# Patient Record
Sex: Male | Born: 1970 | Race: White | Hispanic: No | Marital: Single | Smoking: Current every day smoker
Health system: Southern US, Community
[De-identification: ages and names within clinical notes are randomized; demographics above are authoritative.]

## PROBLEM LIST (undated history)

## (undated) DIAGNOSIS — I219 Acute myocardial infarction, unspecified: Secondary | ICD-10-CM

## (undated) HISTORY — PX: CERVICAL SPINE SURGERY: SHX589

---

## 2011-09-19 ENCOUNTER — Emergency Department: Payer: Self-pay | Admitting: Emergency Medicine

## 2013-02-17 ENCOUNTER — Emergency Department: Payer: Self-pay | Admitting: Emergency Medicine

## 2016-09-24 ENCOUNTER — Emergency Department (HOSPITAL_COMMUNITY)
Admission: EM | Admit: 2016-09-24 | Discharge: 2016-09-24 | Disposition: A | Discharge status: Home / Self Care | Payer: BLUE CROSS/BLUE SHIELD | Attending: Emergency Medicine | Admitting: Emergency Medicine

## 2016-09-24 ENCOUNTER — Emergency Department (HOSPITAL_COMMUNITY): Payer: BLUE CROSS/BLUE SHIELD

## 2016-09-24 ENCOUNTER — Encounter (HOSPITAL_COMMUNITY): Payer: Self-pay | Admitting: Emergency Medicine

## 2016-09-24 DIAGNOSIS — M5412 Radiculopathy, cervical region: Secondary | ICD-10-CM | POA: Diagnosis not present

## 2016-09-24 DIAGNOSIS — F1290 Cannabis use, unspecified, uncomplicated: Secondary | ICD-10-CM | POA: Diagnosis not present

## 2016-09-24 DIAGNOSIS — Z791 Long term (current) use of non-steroidal anti-inflammatories (NSAID): Secondary | ICD-10-CM | POA: Insufficient documentation

## 2016-09-24 DIAGNOSIS — R200 Anesthesia of skin: Secondary | ICD-10-CM | POA: Diagnosis present

## 2016-09-24 DIAGNOSIS — F17210 Nicotine dependence, cigarettes, uncomplicated: Secondary | ICD-10-CM | POA: Insufficient documentation

## 2016-09-24 MED ORDER — PREDNISONE 50 MG PO TABS: ORAL_TABLET | ORAL | 0 refills | Status: DC

## 2016-09-24 MED ORDER — OXYCODONE-ACETAMINOPHEN 5-325 MG PO TABS: 1.0000 | ORAL_TABLET | ORAL | 0 refills | Status: DC | PRN

## 2016-09-24 MED ORDER — NAPROXEN 500 MG PO TABS: 500.0000 mg | ORAL_TABLET | Freq: Two times a day (BID) | ORAL | 0 refills | Status: DC

## 2016-09-24 NOTE — ED Notes (Signed)
Pt resting with eyes closed, appears to be in no distress. Respirations are even and unlabored.  

## 2016-09-24 NOTE — ED Notes (Signed)
MD Cook at bedside. 
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
## 2016-09-24 NOTE — ED Triage Notes (Signed)
Patient states chronic pain in neck and right arm that radiates from right arm to shoulder. States weakness in right arm and hand that started a few weeks ago has gotten worse in the past 3 days.

## 2016-09-24 NOTE — Discharge Instructions (Signed)
You will need to see a neurosurgeon. Phone number given. Prescription for pain medicine, prednisone, anti-inflammatory medicine.

## 2017-05-15 IMAGING — CT CT CERVICAL SPINE W/O CM
4 of 7 series · 13 of 33 positions shown, 14 images · non-contrast
Comparison: None.

CLINICAL DATA: Right neck pain.  Right arm numbness for 2-3 weeks.

EXAM:
CT HEAD WITHOUT CONTRAST
CT CERVICAL SPINE WITHOUT CONTRAST
TECHNIQUE: Multidetector CT imaging of the head and cervical spine was
performed following the standard protocol without intravenous
contrast. Multiplanar CT image reconstructions of the cervical spine
were also generated.

[Series 8: c spine soft · axial · 0.32mm/px · z∈[-38,+94]mm · 4 of 110 slices shown]
[im 22/110  soft-tissue]
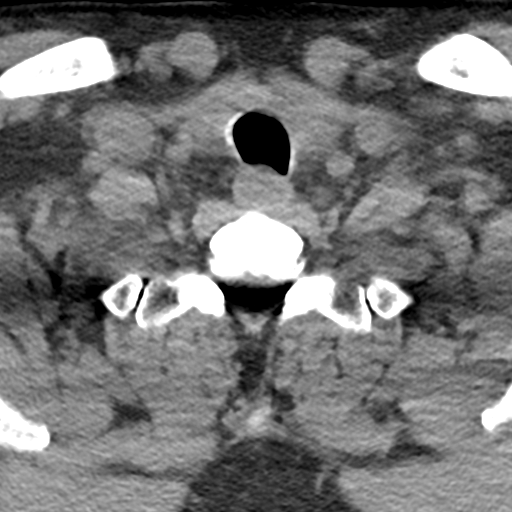
[im 44/110  soft-tissue]
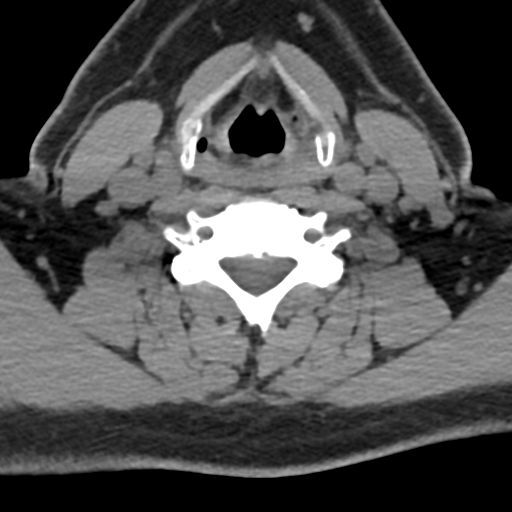
[im 66/110  soft-tissue]
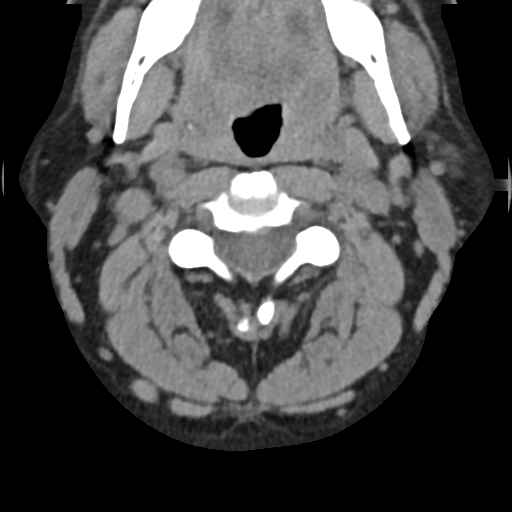
[im 88/110  soft-tissue]
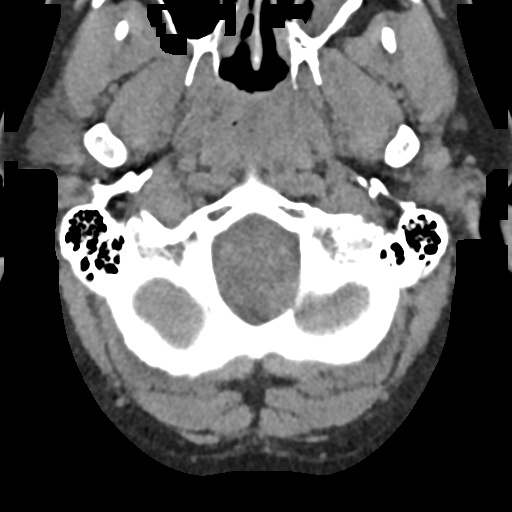

[Series 9: sagittal bone · sagittal · 0.25mm/px · 4 of 61 slices shown]
[im 13/61  bone]
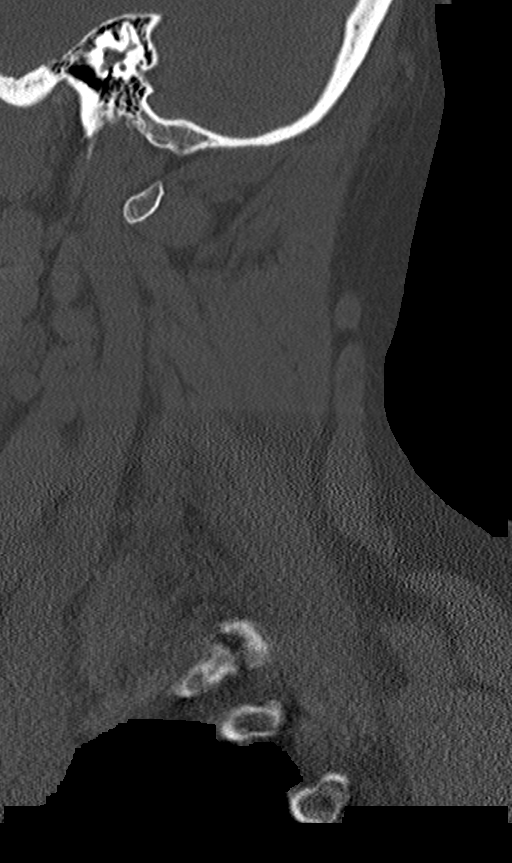
[im 25/61  bone]
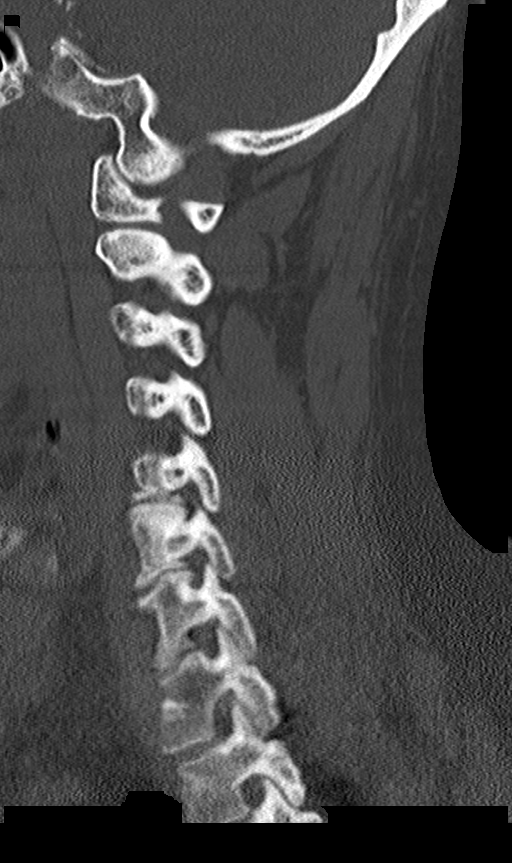
[im 37/61  bone]
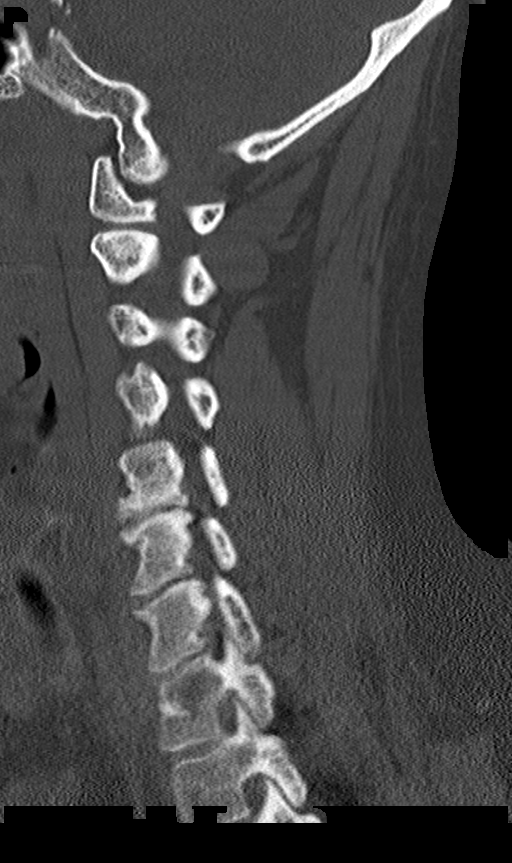
[im 49/61  bone]
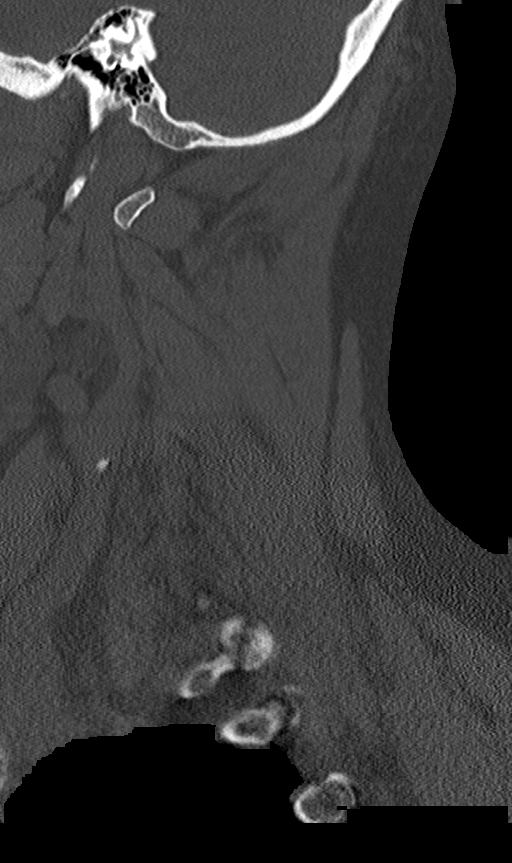

[Series 10: coronal bone · coronal · 0.32mm/px · 1 of 61 slices shown]
[im 31/61  bone]
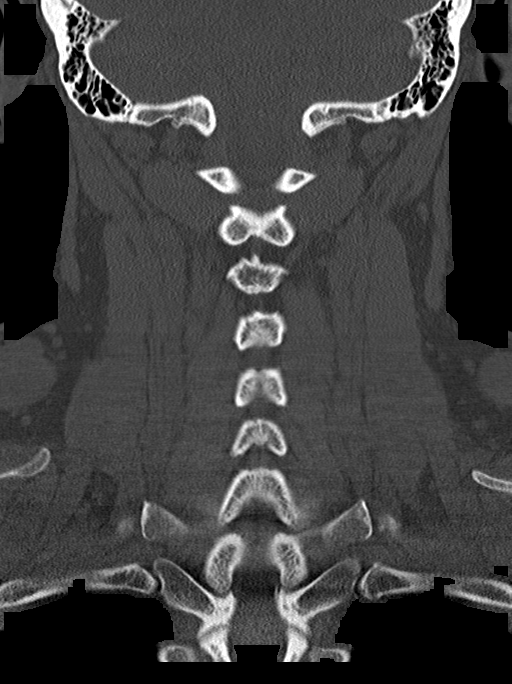

[Series 13: orthogonal bone · axial · 0.21mm/px · z∈[-56,+61]mm · 4 of 96 slices shown, 5 images]
[im 20/96  soft-tissue]
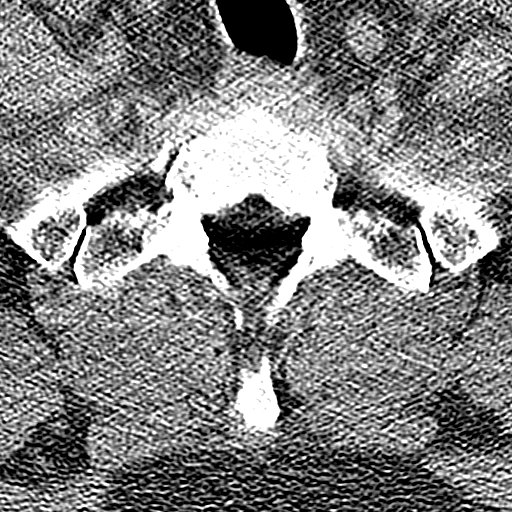
[im 20/96  bone]
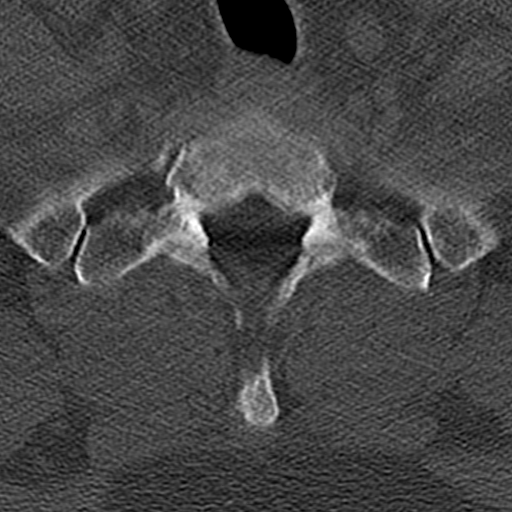
[im 39/96  bone]
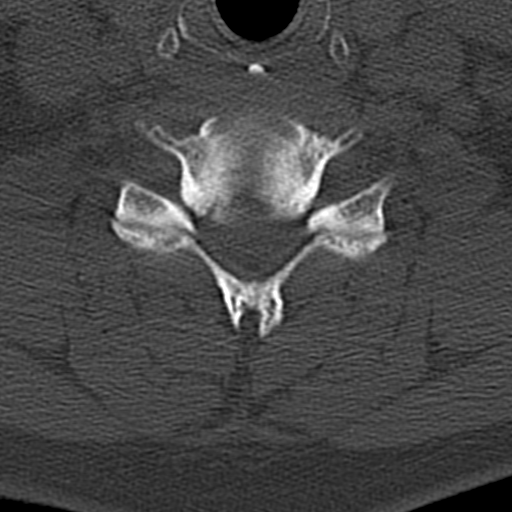
[im 58/96  bone]
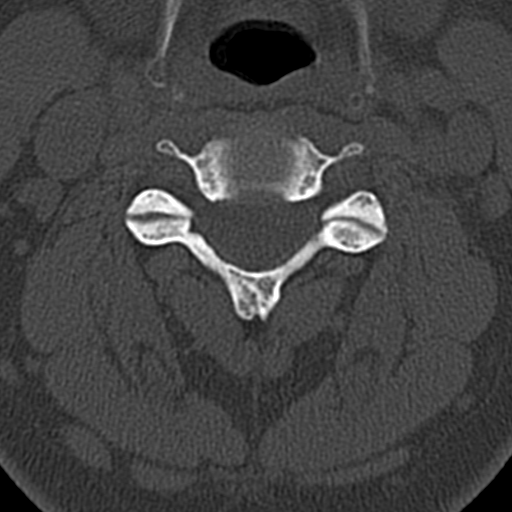
[im 77/96  bone]
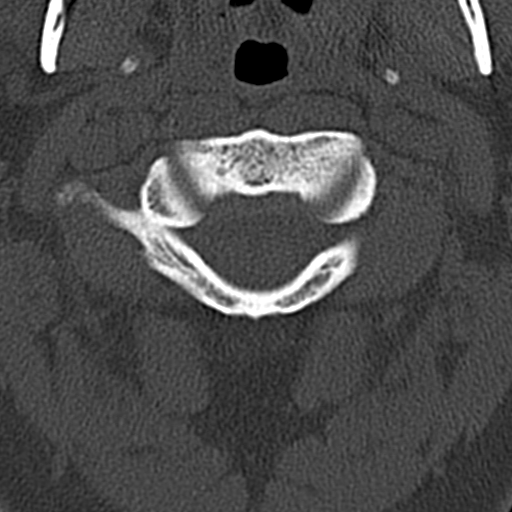

[13 of 33 positions shown; findings below may reference images not displayed]

FINDINGS: Brain: No acute intracranial abnormality. Specifically, no
hemorrhage, hydrocephalus, mass lesion, acute infarction, or
significant intracranial injury.

Vascular: No hyperdense vessel or unexpected calcification.

Skull: No acute calvarial abnormality.

Sinuses/Orbits: Mucosal thickening in the left maxillary sinus and
scattered left ethmoid air cells. Mastoid air cells clear. Orbital
soft tissues unremarkable.

Other: None

CT CERVICAL SPINE FINDINGS

Alignment: Normal

Skull base and vertebrae: No acute fracture. No primary bone lesion
or focal pathologic process.

Soft tissues and spinal canal: No prevertebral fluid or swelling. No
visible canal hematoma.

Disc levels: Disc space narrowing and spurring at C5-6 and C6-7.
Mild bilateral neural foraminal narrowing at C5-6 due to
uncovertebral spurring.

Upper chest: Negative

Other: None
IMPRESSION: No intracranial abnormality.

Chronic sinusitis.

Degenerative changes in the lower cervical spine as above. No acute
findings.

## 2022-07-19 ENCOUNTER — Other Ambulatory Visit: Payer: Self-pay

## 2022-07-19 DIAGNOSIS — Z122 Encounter for screening for malignant neoplasm of respiratory organs: Secondary | ICD-10-CM

## 2022-07-19 DIAGNOSIS — F17210 Nicotine dependence, cigarettes, uncomplicated: Secondary | ICD-10-CM

## 2022-07-19 DIAGNOSIS — Z87891 Personal history of nicotine dependence: Secondary | ICD-10-CM

## 2022-08-29 ENCOUNTER — Ambulatory Visit (INDEPENDENT_AMBULATORY_CARE_PROVIDER_SITE_OTHER): Payer: 59 | Admitting: Acute Care

## 2022-08-29 ENCOUNTER — Encounter: Payer: Self-pay | Admitting: Acute Care

## 2022-08-29 DIAGNOSIS — F17210 Nicotine dependence, cigarettes, uncomplicated: Secondary | ICD-10-CM | POA: Diagnosis not present

## 2022-08-29 NOTE — Patient Instructions (Signed)
Thank you for participating in the [facility]. It was our pleasure to meet you today. We will call you with the results of your scan within the next few days. Your scan will be assigned a Lung RADS category score by the physicians reading the scans.  This Lung RADS score determines follow up scanning.  See below for description of categories, and follow up screening recommendations. We will be in touch to schedule your follow up screening annually or based on recommendations of our providers. We will fax a copy of your scan results to your Primary Care Physician, or the physician who referred you to the program, to ensure they have the results. Please call the office if you have any questions or concerns regarding your scanning experience or results.  Our office number is [phone]. Please speak with Denise Phelps, RN. , or  Denise Buckner RN, They are  our Lung Cancer Screening RN.'s If They are unavailable when you call, Please leave a message on the voice mail. We will return your call at our earliest convenience.This voice mail is monitored several times a day.  Remember, if your scan is normal, we will scan you annually as long as you continue to meet the criteria for the program. (Age 55-77, Current smoker or smoker who has quit within the last 15 years). If you are a smoker, remember, quitting is the single most powerful action that you can take to decrease your risk of lung cancer and other pulmonary, breathing related problems. We know quitting is hard, and we are here to help.  Please let us know if there is anything we can do to help you meet your goal of quitting. If you are a former smoker, congratulations. We are proud of you! Remain smoke free! Remember you can refer friends or family members through the number above.  We will screen them to make sure they meet criteria for the program. Thank you for helping us take better care of you by  participating in Lung Screening.  You can receive free nicotine replacement therapy ( patches, gum or mints) by calling 1-800-QUIT NOW. Please call so we can get you on the path to becoming  a non-smoker. I know it is hard, but you can do this!  Lung RADS Categories:  Lung RADS 1: no nodules or definitely non-concerning nodules.  Recommendation is for a repeat annual scan in 12 months.  Lung RADS 2:  nodules that are non-concerning in appearance and behavior with a very low likelihood of becoming an active cancer. Recommendation is for a repeat annual scan in 12 months.  Lung RADS 3: nodules that are probably non-concerning , includes nodules with a low likelihood of becoming an active cancer.  Recommendation is for a 6-month repeat screening scan. Often noted after an upper respiratory illness. We will be in touch to make sure you have no questions, and to schedule your 6-month scan.  Lung RADS 4 A: nodules with concerning findings, recommendation is most often for a follow up scan in 3 months or additional testing based on our provider's assessment of the scan. We will be in touch to make sure you have no questions and to schedule the recommended 3 month follow up scan.  Lung RADS 4 B:  indicates findings that are concerning. We will be in touch with you to schedule additional diagnostic testing based on our provider's  assessment of the scan.  Other options for assistance in smoking cessation (   As covered by your insurance benefits)  Hypnosis for smoking cessation  [employer] [phone]  Acupuncture for smoking cessation  [facility] [phone]   

## 2022-08-29 NOTE — Progress Notes (Signed)
Virtual Visit via Telephone Note  I connected with Aaron Avila on 08/29/22 at  4:00 PM EDT by telephone and verified that I am speaking with the correct person using two identifiers.  Location: Patient:  At home Provider:  [place], [place], [place], [address]    I discussed the limitations, risks, security and privacy concerns of performing an evaluation and management service by telephone and the availability of in person appointments. I also discussed with the patient that there may be a patient responsible charge related to this service. The patient expressed understanding and agreed to proceed.   Shared Decision Making Visit Lung Cancer Screening Program [phone])   Eligibility: Age 51 y.o. Pack Years Smoking History Calculation 34 pack year smoking history (# packs/per year x # years smoked) Recent History of coughing up blood  no Unexplained weight loss? no ( >Than 15 pounds within the last 6 months ) Prior History Lung / other cancer no (Diagnosis within the last 5 years already requiring surveillance chest CT Scans). Smoking Status Current Smoker Former Smokers: Years since quit:  NA  Quit Date:  NA  Visit Components: Discussion included one or more decision making aids. yes Discussion included risk/benefits of screening. yes Discussion included potential follow up diagnostic testing for abnormal scans. yes Discussion included meaning and risk of over diagnosis. yes Discussion included meaning and risk of False Positives. yes Discussion included meaning of total radiation exposure. yes  Counseling Included: Importance of adherence to annual lung cancer LDCT screening. yes Impact of comorbidities on ability to participate in the program. yes Ability and willingness to under diagnostic treatment. yes  Smoking Cessation Counseling: Current Smokers:  Discussed importance of smoking cessation. yes Information about tobacco cessation classes and interventions  provided to patient. yes Patient provided with "ticket" for LDCT Scan. yes Symptomatic Patient. no  Counseling NA Diagnosis Code: Tobacco Use Z72.0 Asymptomatic Patient yes  Counseling (Intermediate counseling: > three minutes counseling) E9381 Former Smokers:  Discussed the importance of maintaining cigarette abstinence. yes Diagnosis Code: Personal History of Nicotine Dependence. O17.510 Information about tobacco cessation classes and interventions provided to patient. Yes Patient provided with "ticket" for LDCT Scan. yes Written Order for Lung Cancer Screening with LDCT placed in Epic. Yes (CT Chest Lung Cancer Screening Low Dose W/O CM) CHE5277 Z12.2-Screening of respiratory organs Z87.891-Personal history of nicotine dependence  I have spent 25 minutes of face to face/ virtual visit   time with  Aaron Avila discussing the risks and benefits of lung cancer screening. We viewed / discussed a power point together that explained in detail the above noted topics. We paused at intervals to allow for questions to be asked and answered to ensure understanding.We discussed that the single most powerful action that she can take to decrease her risk of developing lung cancer is to quit smoking. We discussed whether or not she is ready to commit to setting a quit date. We discussed options for tools to aid in quitting smoking including nicotine replacement therapy, non-nicotine medications, support groups, Quit Smart classes, and behavior modification. We discussed that often times setting smaller, more achievable goals, such as eliminating 1 cigarette a day for a week and then 2 cigarettes a day for a week can be helpful in slowly decreasing the number of cigarettes smoked. This allows for a sense of accomplishment as well as providing a clinical benefit. I provided  her  with smoking cessation  information  with contact information for community resources,  classes, free nicotine replacement therapy, and  access to mobile apps, text messaging, and on-line smoking cessation help. I have also provided  him  the office contact information in the event he needs to contact me, or the screening staff. We discussed the time and location of the scan, and that either Abigail Miyamoto RN, Karlton Lemon, RN  or I will call / send a letter with the results within 24-72 hours of receiving them. The patient verbalized understanding of all of  the above and had no further questions upon leaving the office. They have my contact information in the event they have any further questions.  I spent 3 minutes counseling on smoking cessation and the health risks of continued tobacco abuse.  I explained to the patient that there has been a high incidence of coronary artery disease noted on these exams. I explained that this is a non-gated exam therefore degree or severity cannot be determined. This patient is not on statin therapy. I have asked the patient to follow-up with their PCP regarding any incidental finding of coronary artery disease and management with diet or medication as their PCP  feels is clinically indicated. The patient verbalized understanding of the above and had no further questions upon completion of the visit.      Bevelyn Ngo, NP 08/29/2022

## 2022-08-30 ENCOUNTER — Ambulatory Visit
Admission: RE | Admit: 2022-08-30 | Discharge: 2022-08-30 | Disposition: A | Discharge status: Home / Self Care | Payer: 59 | Source: Ambulatory Visit | Attending: Family Medicine | Admitting: Family Medicine

## 2022-08-30 DIAGNOSIS — Z122 Encounter for screening for malignant neoplasm of respiratory organs: Secondary | ICD-10-CM | POA: Diagnosis present

## 2022-08-30 DIAGNOSIS — Z87891 Personal history of nicotine dependence: Secondary | ICD-10-CM | POA: Insufficient documentation

## 2022-08-30 DIAGNOSIS — F17210 Nicotine dependence, cigarettes, uncomplicated: Secondary | ICD-10-CM | POA: Insufficient documentation

## 2022-09-04 ENCOUNTER — Other Ambulatory Visit: Payer: Self-pay

## 2022-09-04 DIAGNOSIS — F17210 Nicotine dependence, cigarettes, uncomplicated: Secondary | ICD-10-CM

## 2022-09-04 DIAGNOSIS — Z87891 Personal history of nicotine dependence: Secondary | ICD-10-CM

## 2022-09-04 DIAGNOSIS — Z122 Encounter for screening for malignant neoplasm of respiratory organs: Secondary | ICD-10-CM

## 2023-03-08 ENCOUNTER — Encounter: Payer: Self-pay | Admitting: Gastroenterology

## 2023-03-08 NOTE — H&P (Signed)
Pre-Procedure H&P   Patient ID: Aaron Avila is a 52 y.o. male.  Gastroenterology Provider: Jaynie Collins, DO  Referring Provider: Tawni Pummel, PA PCP: Dorothey Baseman, MD  Date: 03/09/2023  HPI Mr. Aaron Avila is a 52 y.o. male who presents today for Colonoscopy for Colorectal cancer screening .  Patient reports daily bowel movement without melena hematochezia diarrhea or constipation he does note reflux.  He is a chronic tobacco user with 1 pack a day.  He takes 800 mg of ibuprofen daily.  Reflux is overall controlled on PPI.  Hemoglobin 13.4 MCV 93.8 platelets 237,000   History reviewed. No pertinent past medical history.  Past Surgical History:  Procedure Laterality Date   CERVICAL SPINE SURGERY      Family History No h/o GI disease or malignancy  Review of Systems  Constitutional:  Negative for activity change, appetite change, chills, diaphoresis, fatigue, fever and unexpected weight change.  HENT:  Negative for trouble swallowing and voice change.   Respiratory:  Negative for shortness of breath and wheezing.   Cardiovascular:  Negative for chest pain, palpitations and leg swelling.  Gastrointestinal:  Negative for abdominal distention, abdominal pain, anal bleeding, blood in stool, constipation, diarrhea, nausea and vomiting.  Musculoskeletal:  Negative for arthralgias and myalgias.  Skin:  Negative for color change and pallor.  Neurological:  Negative for dizziness, syncope and weakness.  Psychiatric/Behavioral:  Negative for confusion. The patient is not nervous/anxious.   All other systems reviewed and are negative.    Medications No current facility-administered medications on file prior to encounter.   Current Outpatient Medications on File Prior to Encounter  Medication Sig Dispense Refill   ibuprofen (ADVIL,MOTRIN) 200 MG tablet Take 800 mg by mouth every 6 (six) hours as needed.     naproxen (NAPROSYN) 500 MG tablet Take 1 tablet (500 mg  total) by mouth 2 (two) times daily. (Patient not taking: Reported on 03/09/2023) 20 tablet 0   oxyCODONE-acetaminophen (PERCOCET) 5-325 MG tablet Take 1-2 tablets by mouth every 4 (four) hours as needed. (Patient not taking: Reported on 03/09/2023) 20 tablet 0   predniSONE (DELTASONE) 50 MG tablet 1 tablet daily for 7 days (Patient not taking: Reported on 03/09/2023) 7 tablet 0    Pertinent medications related to GI and procedure were reviewed by me with the patient prior to the procedure   Current Facility-Administered Medications:    0.9 %  sodium chloride infusion, , Intravenous, Continuous, Jaynie Collins, DO  sodium chloride         No Known Allergies Allergies were reviewed by me prior to the procedure  Objective   Body mass index is 32.07 kg/m. Vitals:   03/09/23 0851  BP: (!) 141/95  Pulse: 84  Resp: 18  Temp: (!) 96.7 F (35.9 C)  TempSrc: Temporal  SpO2: 96%  Weight: 107.3 kg  Height: 6' (1.829 m)     Physical Exam Vitals and nursing note reviewed.  Constitutional:      General: He is not in acute distress.    Appearance: Normal appearance. He is not ill-appearing, toxic-appearing or diaphoretic.  HENT:     Head: Normocephalic and atraumatic.     Nose: Nose normal.     Mouth/Throat:     Mouth: Mucous membranes are moist.     Pharynx: Oropharynx is clear.  Eyes:     General: No scleral icterus.    Extraocular Movements: Extraocular movements intact.  Cardiovascular:     Rate  and Rhythm: Normal rate and regular rhythm.     Heart sounds: Normal heart sounds. No murmur heard.    No friction rub. No gallop.  Pulmonary:     Effort: Pulmonary effort is normal. No respiratory distress.     Breath sounds: Normal breath sounds. No wheezing, rhonchi or rales.  Abdominal:     General: Bowel sounds are normal. There is no distension.     Palpations: Abdomen is soft.     Tenderness: There is no abdominal tenderness. There is no guarding or rebound.   Musculoskeletal:     Cervical back: Neck supple.     Right lower leg: No edema.     Left lower leg: No edema.  Skin:    General: Skin is warm and dry.     Coloration: Skin is not jaundiced or pale.  Neurological:     General: No focal deficit present.     Mental Status: He is alert and oriented to person, place, and time. Mental status is at baseline.  Psychiatric:        Mood and Affect: Mood normal.        Behavior: Behavior normal.        Thought Content: Thought content normal.        Judgment: Judgment normal.      Assessment:  Mr. Aaron Avila is a 52 y.o. male  who presents today for Colonoscopy for Colorectal cancer screening .  Plan:  Colonoscopy with possible intervention today  Colonoscopy with possible biopsy, control of bleeding, polypectomy, and interventions as necessary has been discussed with the patient/patient representative. Informed consent was obtained from the patient/patient representative after explaining the indication, nature, and risks of the procedure including but not limited to death, bleeding, perforation, missed neoplasm/lesions, cardiorespiratory compromise, and reaction to medications. Opportunity for questions was given and appropriate answers were provided. Patient/patient representative has verbalized understanding is amenable to undergoing the procedure.   Jaynie Collins, DO  [facility] Gastroenterology  Portions of the record may have been created with voice recognition software. Occasional wrong-word or 'sound-a-like' substitutions may have occurred due to the inherent limitations of voice recognition software.  Read the chart carefully and recognize, using context, where substitutions may have occurred.

## 2023-03-09 ENCOUNTER — Encounter
Admission: RE | Disposition: A | Discharge status: Home / Self Care | Payer: Self-pay | Source: Home / Self Care | Attending: Gastroenterology

## 2023-03-09 ENCOUNTER — Ambulatory Visit: Payer: 59 | Admitting: Anesthesiology

## 2023-03-09 ENCOUNTER — Encounter: Payer: Self-pay | Admitting: Gastroenterology

## 2023-03-09 ENCOUNTER — Ambulatory Visit
Admission: RE | Admit: 2023-03-09 | Discharge: 2023-03-09 | Disposition: A | Discharge status: Home / Self Care | Payer: 59 | Attending: Gastroenterology | Admitting: Gastroenterology

## 2023-03-09 DIAGNOSIS — D128 Benign neoplasm of rectum: Secondary | ICD-10-CM | POA: Insufficient documentation

## 2023-03-09 DIAGNOSIS — D125 Benign neoplasm of sigmoid colon: Secondary | ICD-10-CM | POA: Insufficient documentation

## 2023-03-09 DIAGNOSIS — Z1211 Encounter for screening for malignant neoplasm of colon: Secondary | ICD-10-CM | POA: Insufficient documentation

## 2023-03-09 DIAGNOSIS — J449 Chronic obstructive pulmonary disease, unspecified: Secondary | ICD-10-CM | POA: Diagnosis not present

## 2023-03-09 DIAGNOSIS — K219 Gastro-esophageal reflux disease without esophagitis: Secondary | ICD-10-CM | POA: Insufficient documentation

## 2023-03-09 DIAGNOSIS — K5730 Diverticulosis of large intestine without perforation or abscess without bleeding: Secondary | ICD-10-CM | POA: Diagnosis not present

## 2023-03-09 DIAGNOSIS — Z79899 Other long term (current) drug therapy: Secondary | ICD-10-CM | POA: Insufficient documentation

## 2023-03-09 DIAGNOSIS — D120 Benign neoplasm of cecum: Secondary | ICD-10-CM | POA: Diagnosis not present

## 2023-03-09 DIAGNOSIS — F17210 Nicotine dependence, cigarettes, uncomplicated: Secondary | ICD-10-CM | POA: Insufficient documentation

## 2023-03-09 DIAGNOSIS — D124 Benign neoplasm of descending colon: Secondary | ICD-10-CM | POA: Insufficient documentation

## 2023-03-09 HISTORY — PX: COLONOSCOPY WITH PROPOFOL: SHX5780

## 2023-03-09 SURGERY — COLONOSCOPY WITH PROPOFOL
Anesthesia: General

## 2023-03-09 MED ORDER — SIMETHICONE 40 MG/0.6ML PO SUSP
ORAL | Status: DC | PRN
  Administered 2023-03-09: 50 mL

## 2023-03-09 MED ORDER — LIDOCAINE HCL (CARDIAC) PF 100 MG/5ML IV SOSY
PREFILLED_SYRINGE | INTRAVENOUS | Status: DC | PRN
  Administered 2023-03-09: 60 mg via INTRAVENOUS

## 2023-03-09 MED ORDER — PROPOFOL 10 MG/ML IV BOLUS
INTRAVENOUS | Status: DC | PRN
  Administered 2023-03-09: 70 mg via INTRAVENOUS

## 2023-03-09 MED ORDER — SODIUM CHLORIDE 0.9 % IV SOLN
INTRAVENOUS | Status: DC
  Administered 2023-03-09: 1000 mL via INTRAVENOUS

## 2023-03-09 MED ORDER — PROPOFOL 500 MG/50ML IV EMUL
INTRAVENOUS | Status: DC | PRN
  Administered 2023-03-09: 140 ug/kg/min via INTRAVENOUS

## 2023-03-09 NOTE — Transfer of Care (Signed)
Immediate Anesthesia Transfer of Care Note  Patient: Aaron Avila  Procedure(s) Performed: COLONOSCOPY WITH PROPOFOL  Patient Location: PACU  Anesthesia Type:General  Level of Consciousness: awake, alert , and oriented  Airway & Oxygen Therapy: Patient Spontanous Breathing  Post-op Assessment: Report given to RN and Post -op Vital signs reviewed and stable  Post vital signs: Reviewed and stable  Last Vitals:  Vitals Value Taken Time  BP 96/58 03/09/23 0947  Temp 35.6 C 03/09/23 0946  Pulse 81 03/09/23 0949  Resp 16 03/09/23 0949  SpO2 93 % 03/09/23 0949  Vitals shown include unvalidated device data.  Last Pain:  Vitals:   03/09/23 0946  TempSrc: Temporal  PainSc: Asleep         Complications: No notable events documented.

## 2023-03-09 NOTE — Interval H&P Note (Signed)
History and Physical Interval Note: Preprocedure H&P from 03/09/23  was reviewed and there was no interval change after seeing and examining the patient.  Written consent was obtained from the patient after discussion of risks, benefits, and alternatives. Patient has consented to proceed with Colonoscopy with possible intervention   03/09/2023 9:10 AM  [name]  has presented today for surgery, with the diagnosis of Colon Cancer Screening.  The various methods of treatment have been discussed with the patient and family. After consideration of risks, benefits and other options for treatment, the patient has consented to  Procedure(s): COLONOSCOPY WITH PROPOFOL (N/A) as a surgical intervention.  The patient's history has been reviewed, patient examined, no change in status, stable for surgery.  I have reviewed the patient's chart and labs.  Questions were answered to the patient's satisfaction.     Aaron Avila

## 2023-03-09 NOTE — Op Note (Signed)
[facility] Gastroenterology Patient Name: Aaron Avila Procedure Date: 03/09/2023 9:02 AM MRN: [id number] Account #: [id number] Date of Birth: 06-06-1971 Admit Type: Outpatient Age: 52 Room: [facility] ENDO ROOM 1 Gender: Male Note Status: Finalized Instrument Name: Colonoscope [id number] Procedure:             Colonoscopy Indications:           Screening for colorectal malignant neoplasm Providers:             Jaynie Collins DO, DO Medicines:             Monitored Anesthesia Care Complications:         No immediate complications. Estimated blood loss:                         Minimal. Procedure:             Pre-Anesthesia Assessment:                        - Prior to the procedure, a History and Physical was                         performed, and patient medications and allergies were                         reviewed. The patient is competent. The risks and                         benefits of the procedure and the sedation options and                         risks were discussed with the patient. All questions                         were answered and informed consent was obtained.                         Patient identification and proposed procedure were                         verified by the physician, the nurse, the anesthetist                         and the technician in the endoscopy suite. Mental                         Status Examination: alert and oriented. Airway                         Examination: normal oropharyngeal airway and neck                         mobility. Respiratory Examination: clear to                         auscultation. CV Examination: RRR, no murmurs, no S3                         or S4. Prophylactic Antibiotics: The patient does not  require prophylactic antibiotics. Prior                         Anticoagulants: The patient has taken no anticoagulant                         or antiplatelet agents. ASA Grade  Assessment: II - A                         patient with mild systemic disease. After reviewing                         the risks and benefits, the patient was deemed in                         satisfactory condition to undergo the procedure. The                         anesthesia plan was to use monitored anesthesia care                         (MAC). Immediately prior to administration of                         medications, the patient was re-assessed for adequacy                         to receive sedatives. The heart rate, respiratory                         rate, oxygen saturations, blood pressure, adequacy of                         pulmonary ventilation, and response to care were                         monitored throughout the procedure. The physical                         status of the patient was re-assessed after the                         procedure.                        After obtaining informed consent, the colonoscope was                         passed under direct vision. Throughout the procedure,                         the patient's blood pressure, pulse, and oxygen                         saturations were monitored continuously. The                         Colonoscope was introduced through the anus and  advanced to the the terminal ileum, with                         identification of the appendiceal orifice and IC                         valve. The colonoscopy was performed without                         difficulty. The patient tolerated the procedure well.                         The quality of the bowel preparation was evaluated                         using the BBPS [facility] Bowel Preparation Scale) with                         scores of: Right Colon = 2 (minor amount of residual                         staining, small fragments of stool and/or opaque                         liquid, but mucosa seen well), Transverse Colon = 3                          (entire mucosa seen well with no residual staining,                         small fragments of stool or opaque liquid) and Left                         Colon = 3 (entire mucosa seen well with no residual                         staining, small fragments of stool or opaque liquid).                         The total BBPS score equals 8. The quality of the                         bowel preparation was excellent. The terminal ileum,                         ileocecal valve, appendiceal orifice, and rectum were                         photographed. Findings:      The perianal and digital rectal examinations were normal. Pertinent       negatives include normal sphincter tone.      The terminal ileum appeared normal. Estimated blood loss: none.      Five sessile polyps were found in the rectum, sigmoid colon, descending       colon and cecum. The polyps were 1 to 2 mm in size. These polyps were       removed with a  jumbo cold forceps. Resection and retrieval were       complete. Estimated blood loss was minimal.      Two sessile polyps were found in the rectum. The polyps were 3 to 5 mm       in size. These polyps were removed with a cold snare. Resection and       retrieval were complete. Estimated blood loss was minimal.      Multiple small-mouthed diverticula were found in the recto-sigmoid colon       and sigmoid colon. Estimated blood loss: none.      The exam was otherwise without abnormality on direct and retroflexion       views. Impression:            - The examined portion of the ileum was normal.                        - Five 1 to 2 mm polyps in the rectum, in the sigmoid                         colon, in the descending colon and in the cecum,                         removed with a jumbo cold forceps. Resected and                         retrieved.                        - Two 3 to 5 mm polyps in the rectum, removed with a                         cold snare. Resected and  retrieved.                        - Diverticulosis in the recto-sigmoid colon and in the                         sigmoid colon.                        - The examination was otherwise normal on direct and                         retroflexion views. Recommendation:        - Patient has a contact number available for                         emergencies. The signs and symptoms of potential                         delayed complications were discussed with the patient.                         Return to normal activities tomorrow. Written                         discharge instructions were provided to the patient.                        -  Discharge patient to home.                        - Resume previous diet.                        - Continue present medications.                        - No ibuprofen, naproxen, or other non-steroidal                         anti-inflammatory drugs for 5 days after polyp removal.                        - Await pathology results.                        - Repeat colonoscopy for surveillance based on                         pathology results.                        - Return to GI office as previously scheduled.                        - Recommend arranging upper endoscopy (EGD) to screen                         for Barretts Esophagus                        - The findings and recommendations were discussed with                         the patient. Procedure Code(s):     --- Professional ---                        [phone], Colonoscopy, flexible; with removal of                         tumor(s), polyp(s), or other lesion(s) by snare                         technique                        45380, 59, Colonoscopy, flexible; with biopsy, single                         or multiple Diagnosis Code(s):     --- Professional ---                        Z12.11, Encounter for screening for malignant neoplasm                         of colon                        D12.5, Benign  neoplasm of sigmoid colon  D12.4, Benign neoplasm of descending colon                        D12.0, Benign neoplasm of cecum                        D12.8, Benign neoplasm of rectum                        K57.30, Diverticulosis of large intestine without                         perforation or abscess without bleeding CPT copyright 2022 American Medical Association. All rights reserved. The codes documented in this report are preliminary and upon coder review may  be revised to meet current compliance requirements. Attending Participation:      I personally performed the entire procedure. Elfredia Nevins, DO Jaynie Collins DO, DO 03/09/2023 9:50:04 AM This report has been signed electronically. Number of Addenda: 0 Note Initiated On: 03/09/2023 9:02 AM Scope Withdrawal Time: 0 hours 20 minutes 14 seconds  Total Procedure Duration: 0 hours 24 minutes 6 seconds  Estimated Blood Loss:  Estimated blood loss was minimal.      [facility]

## 2023-03-09 NOTE — Anesthesia Preprocedure Evaluation (Signed)
Anesthesia Evaluation  Patient identified by MRN, date of birth, ID band Patient awake    Reviewed: Allergy & Precautions, NPO status , Patient's Chart, lab work & pertinent test results  Airway Mallampati: II  TM Distance: >3 FB Neck ROM: full    Dental  (+) Teeth Intact   Pulmonary neg pulmonary ROS, COPD, Current Smoker and Patient abstained from smoking.   Pulmonary exam normal  + decreased breath sounds      Cardiovascular Exercise Tolerance: Good negative cardio ROS Normal cardiovascular exam Rhythm:Regular Rate:Normal     Neuro/Psych negative neurological ROS  negative psych ROS   GI/Hepatic negative GI ROS, Neg liver ROS,,,  Endo/Other  negative endocrine ROS    Renal/GU negative Renal ROS  negative genitourinary   Musculoskeletal   Abdominal  (+) + obese  Peds negative pediatric ROS (+)  Hematology negative hematology ROS (+)   Anesthesia Other Findings History reviewed. No pertinent past medical history.  Past Surgical History: No date: CERVICAL SPINE SURGERY  BMI    Body Mass Index: 32.07 kg/m      Reproductive/Obstetrics negative OB ROS                             Anesthesia Physical Anesthesia Plan  ASA: 2  Anesthesia Plan: General   Post-op Pain Management:    Induction: Intravenous  PONV Risk Score and Plan: Propofol infusion and TIVA  Airway Management Planned: Natural Airway  Additional Equipment:   Intra-op Plan:   Post-operative Plan:   Informed Consent: I have reviewed the patients History and Physical, chart, labs and discussed the procedure including the risks, benefits and alternatives for the proposed anesthesia with the patient or authorized representative who has indicated his/her understanding and acceptance.     Dental Advisory Given  Plan Discussed with: CRNA and Surgeon  Anesthesia Plan Comments:        Anesthesia Quick  Evaluation

## 2023-03-12 ENCOUNTER — Encounter: Payer: Self-pay | Admitting: Gastroenterology

## 2023-03-12 LAB — SURGICAL PATHOLOGY

## 2023-03-14 ENCOUNTER — Encounter: Payer: Self-pay | Admitting: Gastroenterology

## 2023-03-16 NOTE — Anesthesia Postprocedure Evaluation (Signed)
Anesthesia Post Note  Patient: Aaron Avila  Procedure(s) Performed: COLONOSCOPY WITH PROPOFOL  Patient location during evaluation: PACU Anesthesia Type: General Level of consciousness: awake and oriented Pain management: satisfactory to patient Vital Signs Assessment: post-procedure vital signs reviewed and stable Respiratory status: spontaneous breathing and respiratory function stable Cardiovascular status: stable Anesthetic complications: no   No notable events documented.   Last Vitals:  Vitals:   03/09/23 0851 03/09/23 0946  BP: (!) 141/95 (!) 96/58  Pulse: 84   Resp: 18   Temp: (!) 35.9 C (!) 35.6 C  SpO2: 96%     Last Pain:  Vitals:   03/09/23 1006  TempSrc:   PainSc: 0-No pain                 VAN STAVEREN,Cienna Dumais

## 2023-09-03 ENCOUNTER — Ambulatory Visit: Admission: RE | Admit: 2023-09-03 | Payer: 59 | Source: Ambulatory Visit

## 2024-07-28 ENCOUNTER — Inpatient Hospital Stay (HOSPITAL_COMMUNITY)
Admission: EM | Admit: 2024-07-28 | Discharge: 2024-07-30 | DRG: 322 | Disposition: A | Discharge status: Home / Self Care | Attending: Cardiovascular Disease | Admitting: Cardiovascular Disease

## 2024-07-28 ENCOUNTER — Encounter (HOSPITAL_COMMUNITY)
Admission: EM | Disposition: A | Discharge status: Home / Self Care | Payer: Self-pay | Source: Home / Self Care | Attending: Cardiovascular Disease

## 2024-07-28 ENCOUNTER — Other Ambulatory Visit: Payer: Self-pay

## 2024-07-28 ENCOUNTER — Emergency Department (HOSPITAL_COMMUNITY)

## 2024-07-28 ENCOUNTER — Encounter (HOSPITAL_COMMUNITY): Payer: Self-pay | Admitting: Emergency Medicine

## 2024-07-28 DIAGNOSIS — I10 Essential (primary) hypertension: Secondary | ICD-10-CM | POA: Diagnosis present

## 2024-07-28 DIAGNOSIS — I255 Ischemic cardiomyopathy: Secondary | ICD-10-CM | POA: Diagnosis present

## 2024-07-28 DIAGNOSIS — F17200 Nicotine dependence, unspecified, uncomplicated: Secondary | ICD-10-CM | POA: Insufficient documentation

## 2024-07-28 DIAGNOSIS — E669 Obesity, unspecified: Secondary | ICD-10-CM | POA: Diagnosis present

## 2024-07-28 DIAGNOSIS — Z8249 Family history of ischemic heart disease and other diseases of the circulatory system: Secondary | ICD-10-CM | POA: Diagnosis not present

## 2024-07-28 DIAGNOSIS — I2489 Other forms of acute ischemic heart disease: Secondary | ICD-10-CM | POA: Diagnosis not present

## 2024-07-28 DIAGNOSIS — I2510 Atherosclerotic heart disease of native coronary artery without angina pectoris: Secondary | ICD-10-CM | POA: Diagnosis present

## 2024-07-28 DIAGNOSIS — I2111 ST elevation (STEMI) myocardial infarction involving right coronary artery: Principal | ICD-10-CM | POA: Diagnosis present

## 2024-07-28 DIAGNOSIS — E7801 Familial hypercholesterolemia: Secondary | ICD-10-CM | POA: Diagnosis present

## 2024-07-28 DIAGNOSIS — I213 ST elevation (STEMI) myocardial infarction of unspecified site: Principal | ICD-10-CM

## 2024-07-28 DIAGNOSIS — Z79899 Other long term (current) drug therapy: Secondary | ICD-10-CM | POA: Diagnosis not present

## 2024-07-28 DIAGNOSIS — R0789 Other chest pain: Secondary | ICD-10-CM | POA: Diagnosis present

## 2024-07-28 DIAGNOSIS — Z7982 Long term (current) use of aspirin: Secondary | ICD-10-CM

## 2024-07-28 DIAGNOSIS — Z6832 Body mass index (BMI) 32.0-32.9, adult: Secondary | ICD-10-CM | POA: Diagnosis not present

## 2024-07-28 DIAGNOSIS — I472 Ventricular tachycardia, unspecified: Secondary | ICD-10-CM | POA: Diagnosis not present

## 2024-07-28 DIAGNOSIS — Z7984 Long term (current) use of oral hypoglycemic drugs: Secondary | ICD-10-CM

## 2024-07-28 DIAGNOSIS — F17210 Nicotine dependence, cigarettes, uncomplicated: Secondary | ICD-10-CM | POA: Diagnosis present

## 2024-07-28 DIAGNOSIS — Z955 Presence of coronary angioplasty implant and graft: Secondary | ICD-10-CM

## 2024-07-28 LAB — TROPONIN I (HIGH SENSITIVITY): Troponin I (High Sensitivity): 960 ng/L (ref <18)

## 2024-07-28 LAB — CBC WITH DIFFERENTIAL/PLATELET
Abs Immature Granulocytes: 0.02 K/uL (ref 0.00–0.07)
Basophils Absolute: 0 K/uL (ref 0.0–0.1)
Basophils Relative: 0 %
Eosinophils Absolute: 0.1 K/uL (ref 0.0–0.5)
Eosinophils Relative: 1 %
HCT: 43.5 % (ref 39.0–52.0)
Hemoglobin: 14.7 g/dL (ref 13.0–17.0)
Immature Granulocytes: 0 %
Lymphocytes Relative: 30 %
Lymphs Abs: 3.7 K/uL (ref 0.7–4.0)
MCH: 31.2 pg (ref 26.0–34.0)
MCHC: 33.8 g/dL (ref 30.0–36.0)
MCV: 92.4 fL (ref 80.0–100.0)
Monocytes Absolute: 1 K/uL (ref 0.1–1.0)
Monocytes Relative: 8 %
Neutro Abs: 7.5 K/uL (ref 1.7–7.7)
Neutrophils Relative %: 61 %
Platelets: 288 K/uL (ref 150–400)
RBC: 4.71 MIL/uL (ref 4.22–5.81)
RDW: 12.7 % (ref 11.5–15.5)
WBC: 12.3 K/uL — ABNORMAL HIGH (ref 4.0–10.5)
nRBC: 0 % (ref 0.0–0.2)

## 2024-07-28 LAB — POCT I-STAT, CHEM 8
BUN: 14 mg/dL (ref 6–20)
Calcium, Ion: 1.15 mmol/L (ref 1.15–1.40)
Chloride: 105 mmol/L (ref 98–111)
Creatinine, Ser: 0.8 mg/dL (ref 0.61–1.24)
Glucose, Bld: 133 mg/dL — ABNORMAL HIGH (ref 70–99)
HCT: 37 % — ABNORMAL LOW (ref 39.0–52.0)
Hemoglobin: 12.6 g/dL — ABNORMAL LOW (ref 13.0–17.0)
Potassium: 4.2 mmol/L (ref 3.5–5.1)
Sodium: 142 mmol/L (ref 135–145)
TCO2: 25 mmol/L (ref 22–32)

## 2024-07-28 LAB — COMPREHENSIVE METABOLIC PANEL WITH GFR
ALT: 22 U/L (ref 0–44)
AST: 34 U/L (ref 15–41)
Albumin: 3.7 g/dL (ref 3.5–5.0)
Alkaline Phosphatase: 66 U/L (ref 38–126)
Anion gap: 14 (ref 5–15)
BUN: 16 mg/dL (ref 6–20)
CO2: 24 mmol/L (ref 22–32)
Calcium: 8.8 mg/dL — ABNORMAL LOW (ref 8.9–10.3)
Chloride: 103 mmol/L (ref 98–111)
Creatinine, Ser: 0.82 mg/dL (ref 0.61–1.24)
GFR, Estimated: >60 mL/min (ref >60)
Glucose, Bld: 215 mg/dL — ABNORMAL HIGH (ref 70–99)
Potassium: 4 mmol/L (ref 3.5–5.1)
Sodium: 141 mmol/L (ref 135–145)
Total Bilirubin: 0.5 mg/dL (ref 0.0–1.2)
Total Protein: 7.3 g/dL (ref 6.5–8.1)

## 2024-07-28 LAB — LIPID PANEL
Cholesterol: 305 mg/dL — ABNORMAL HIGH (ref 0–200)
HDL: 48 mg/dL (ref >40)
LDL Cholesterol: 221 mg/dL — ABNORMAL HIGH (ref 0–99)
Total CHOL/HDL Ratio: 6.4 ratio
Triglycerides: 179 mg/dL — ABNORMAL HIGH (ref <150)
VLDL: 36 mg/dL (ref 0–40)

## 2024-07-28 LAB — GLUCOSE, CAPILLARY: Glucose-Capillary: 117 mg/dL — ABNORMAL HIGH (ref 70–99)

## 2024-07-28 LAB — LACTIC ACID, PLASMA: Lactic Acid, Venous: 2.3 mmol/L (ref 0.5–1.9)

## 2024-07-28 LAB — APTT: aPTT: 26 s (ref 24–36)

## 2024-07-28 LAB — POCT ACTIVATED CLOTTING TIME
Activated Clotting Time: 262 s
Activated Clotting Time: 273 s

## 2024-07-28 LAB — PROTIME-INR
INR: 0.9 (ref 0.8–1.2)
Prothrombin Time: 12.8 s (ref 11.4–15.2)

## 2024-07-28 LAB — CG4 I-STAT (LACTIC ACID): Lactic Acid, Venous: 1.6 mmol/L (ref 0.5–1.9)

## 2024-07-28 SURGERY — LEFT HEART CATH AND CORONARY ANGIOGRAPHY
Anesthesia: LOCAL

## 2024-07-28 MED ORDER — VERAPAMIL HCL 2.5 MG/ML IV SOLN
INTRAVENOUS | Status: AC
  Filled 2024-07-28: qty 2

## 2024-07-28 MED ORDER — NITROGLYCERIN 1 MG/10 ML FOR IR/CATH LAB
INTRA_ARTERIAL | Status: AC
  Filled 2024-07-28: qty 10

## 2024-07-28 MED ORDER — SODIUM CHLORIDE 0.9 % IV SOLN: 250.0000 mL | INTRAVENOUS | Status: AC | PRN

## 2024-07-28 MED ORDER — HEPARIN SODIUM (PORCINE) 5000 UNIT/ML IJ SOLN
4000.0000 [IU] | Freq: Once | INTRAMUSCULAR | Status: AC
  Administered 2024-07-28: 4000 [IU] via INTRAVENOUS
  Filled 2024-07-28: qty 1

## 2024-07-28 MED ORDER — FENTANYL CITRATE (PF) 100 MCG/2ML IJ SOLN
INTRAMUSCULAR | Status: DC | PRN
  Administered 2024-07-28: 25 ug via INTRAVENOUS
  Administered 2024-07-28: 50 ug via INTRAVENOUS

## 2024-07-28 MED ORDER — DIAZEPAM 5 MG PO TABS: 5.0000 mg | ORAL_TABLET | Freq: Three times a day (TID) | ORAL | Status: DC | PRN

## 2024-07-28 MED ORDER — HEPARIN SODIUM (PORCINE) 1000 UNIT/ML IJ SOLN
INTRAMUSCULAR | Status: AC
  Filled 2024-07-28: qty 10

## 2024-07-28 MED ORDER — LIDOCAINE HCL (PF) 1 % IJ SOLN
INTRAMUSCULAR | Status: DC | PRN
  Administered 2024-07-28: 2 mL

## 2024-07-28 MED ORDER — PRASUGREL HCL 10 MG PO TABS
ORAL_TABLET | ORAL | Status: AC
Start: 2024-07-28 — End: 2024-07-28
  Filled 2024-07-28: qty 6

## 2024-07-28 MED ORDER — PRASUGREL HCL 10 MG PO TABS
ORAL_TABLET | ORAL | Status: DC | PRN
  Administered 2024-07-28: 60 mg via ORAL

## 2024-07-28 MED ORDER — IOHEXOL 350 MG/ML SOLN
INTRAVENOUS | Status: DC | PRN
  Administered 2024-07-28: 150 mL

## 2024-07-28 MED ORDER — HEPARIN (PORCINE) IN NACL 1000-0.9 UT/500ML-% IV SOLN
INTRAVENOUS | Status: DC | PRN
  Administered 2024-07-28: 500 mL

## 2024-07-28 MED ORDER — ACETAMINOPHEN 325 MG PO TABS: 650.0000 mg | ORAL_TABLET | ORAL | Status: DC | PRN

## 2024-07-28 MED ORDER — LABETALOL HCL 5 MG/ML IV SOLN: 10.0000 mg | INTRAVENOUS | Status: AC | PRN

## 2024-07-28 MED ORDER — ATORVASTATIN CALCIUM 80 MG PO TABS
80.0000 mg | ORAL_TABLET | Freq: Every day | ORAL | Status: DC
  Administered 2024-07-29 – 2024-07-30 (×2): 80 mg via ORAL
  Filled 2024-07-28 (×2): qty 1

## 2024-07-28 MED ORDER — NITROGLYCERIN 0.4 MG SL SUBL: 0.4000 mg | SUBLINGUAL_TABLET | SUBLINGUAL | Status: DC | PRN

## 2024-07-28 MED ORDER — SODIUM CHLORIDE 0.9% FLUSH
3.0000 mL | Freq: Two times a day (BID) | INTRAVENOUS | Status: DC
  Administered 2024-07-29 – 2024-07-30 (×4): 3 mL via INTRAVENOUS

## 2024-07-28 MED ORDER — SODIUM CHLORIDE 0.9% FLUSH: 3.0000 mL | INTRAVENOUS | Status: DC | PRN

## 2024-07-28 MED ORDER — ONDANSETRON HCL 4 MG/2ML IJ SOLN: 4.0000 mg | Freq: Four times a day (QID) | INTRAMUSCULAR | Status: DC | PRN

## 2024-07-28 MED ORDER — NITROGLYCERIN 0.4 MG SL SUBL
0.4000 mg | SUBLINGUAL_TABLET | Freq: Once | SUBLINGUAL | Status: AC
  Administered 2024-07-28: 0.4 mg via SUBLINGUAL
  Filled 2024-07-28: qty 1

## 2024-07-28 MED ORDER — VERAPAMIL HCL 2.5 MG/ML IV SOLN
INTRAVENOUS | Status: DC | PRN
  Administered 2024-07-28: 10 mL via INTRA_ARTERIAL

## 2024-07-28 MED ORDER — PRASUGREL HCL 10 MG PO TABS
10.0000 mg | ORAL_TABLET | Freq: Every day | ORAL | Status: DC
  Administered 2024-07-29 – 2024-07-30 (×2): 10 mg via ORAL
  Filled 2024-07-28 (×2): qty 1

## 2024-07-28 MED ORDER — SODIUM CHLORIDE 0.9 % IV SOLN: INTRAVENOUS | Status: AC

## 2024-07-28 MED ORDER — HYDRALAZINE HCL 20 MG/ML IJ SOLN: 10.0000 mg | INTRAMUSCULAR | Status: AC | PRN

## 2024-07-28 MED ORDER — CHLORHEXIDINE GLUCONATE CLOTH 2 % EX PADS
6.0000 | MEDICATED_PAD | Freq: Every day | CUTANEOUS | Status: DC
  Administered 2024-07-29: 6 via TOPICAL

## 2024-07-28 MED ORDER — MORPHINE SULFATE (PF) 2 MG/ML IV SOLN: 2.0000 mg | INTRAVENOUS | Status: DC | PRN

## 2024-07-28 MED ORDER — NITROGLYCERIN 1 MG/10 ML FOR IR/CATH LAB
INTRA_ARTERIAL | Status: DC | PRN
  Administered 2024-07-28: 200 ug via INTRACORONARY

## 2024-07-28 MED ORDER — FENTANYL CITRATE (PF) 100 MCG/2ML IJ SOLN
INTRAMUSCULAR | Status: AC
  Filled 2024-07-28: qty 2

## 2024-07-28 MED ORDER — MIDAZOLAM HCL 2 MG/2ML IJ SOLN
INTRAMUSCULAR | Status: DC | PRN
  Administered 2024-07-28 (×2): 2 mg via INTRAVENOUS

## 2024-07-28 MED ORDER — LIDOCAINE HCL (PF) 1 % IJ SOLN
INTRAMUSCULAR | Status: AC
  Filled 2024-07-28: qty 30

## 2024-07-28 MED ORDER — MIDAZOLAM HCL 2 MG/2ML IJ SOLN
INTRAMUSCULAR | Status: AC
  Filled 2024-07-28: qty 2

## 2024-07-28 MED ORDER — MORPHINE SULFATE (PF) 4 MG/ML IV SOLN
4.0000 mg | Freq: Once | INTRAVENOUS | Status: AC
  Administered 2024-07-28: 4 mg via INTRAVENOUS
  Filled 2024-07-28: qty 1

## 2024-07-28 MED ORDER — HEPARIN SODIUM (PORCINE) 1000 UNIT/ML IJ SOLN
INTRAMUSCULAR | Status: DC | PRN
  Administered 2024-07-28: 8000 [IU] via INTRAVENOUS
  Administered 2024-07-28: 3000 [IU] via INTRAVENOUS
  Administered 2024-07-28: 4000 [IU] via INTRAVENOUS

## 2024-07-28 MED ORDER — VERAPAMIL HCL 2.5 MG/ML IV SOLN
INTRAVENOUS | Status: DC | PRN
  Administered 2024-07-28 (×3): 200 ug via INTRACORONARY

## 2024-07-28 MED ORDER — ASPIRIN 81 MG PO CHEW
81.0000 mg | CHEWABLE_TABLET | Freq: Every day | ORAL | Status: DC
  Administered 2024-07-29 – 2024-07-30 (×2): 81 mg via ORAL
  Filled 2024-07-28 (×2): qty 1

## 2024-07-28 MED ORDER — FREE WATER: 500.0000 mL | Freq: Once | Status: DC

## 2024-07-28 MED ORDER — ASPIRIN 81 MG PO CHEW
324.0000 mg | CHEWABLE_TABLET | Freq: Once | ORAL | Status: AC
  Administered 2024-07-28: 324 mg via ORAL
  Filled 2024-07-28: qty 4

## 2024-07-28 MED ORDER — OXYCODONE HCL 5 MG PO TABS: 5.0000 mg | ORAL_TABLET | ORAL | Status: DC | PRN

## 2024-07-28 MED ORDER — ASPIRIN 325 MG PO TBEC
325.0000 mg | DELAYED_RELEASE_TABLET | Freq: Once | ORAL | Status: AC
  Administered 2024-07-28: 325 mg via ORAL
  Filled 2024-07-28: qty 1

## 2024-07-28 SURGICAL SUPPLY — 14 items
BALLOON EMERGE MR 2.5X12 (BALLOONS) IMPLANT
CATH 5FR JL3.5 JR4 ANG PIG MP (CATHETERS) IMPLANT
CATH LAUNCHER 6FR JR4 (CATHETERS) IMPLANT
DEVICE RAD COMP TR BAND LRG (VASCULAR PRODUCTS) IMPLANT
GLIDESHEATH SLEND SS 6F .021 (SHEATH) IMPLANT
GUIDEWIRE INQWIRE 1.5J.035X260 (WIRE) IMPLANT
KIT SINGLE USE MANIFOLD (KITS) IMPLANT
KIT SYRINGE INJ CVI SPIKEX1 (MISCELLANEOUS) IMPLANT
PACK CARDIAC CATHETERIZATION (CUSTOM PROCEDURE TRAY) ×1 IMPLANT
SET ATX-X65L (MISCELLANEOUS) IMPLANT
STENT SYNERGY XD 3.0X24 (Permanent Stent) IMPLANT
STENT SYNERGY XD 3.50X24 (Permanent Stent) IMPLANT
WIRE ASAHI PROWATER 180CM (WIRE) IMPLANT
WIRE RUNTHROUGH .014X180CM (WIRE) IMPLANT

## 2024-07-28 NOTE — ED Provider Notes (Signed)
 Patient is a obese 53 year old male history of tobacco use and a father that died in his 68s of a massive heart attack, presents with a complaint of chest pain, woke up this morning with it, has fluctuated throughout the day but has not gone away since he got home from work tonight.  He does have some shortness of breath but no diaphoresis or nausea.  On his exam he has a normal heart rate, he appears very uncomfortable but is not diaphoretic.  He has good pulses, no edema of the legs.  His EKG is very abnormal showing ST elevations in the inferior leads with possible reciprocal changes only in aVL.  Activated STEMI upon seeing the abnormal EKG at 839, this was different than his initial EKG which had a hint of possible pericarditis but did not meet STEMI criteria at 8:23 PM.  Labs x-ray heparin  nitro morphine  oxygen, anticipate transfer to [facility] for stat heart catheterization.  Patient critically ill  I discussed the care of this patient with Dr. Verlin of the cardiology service who has accepted the patient in transfer to the heart catheterization lab    Cleotilde Rogue, MD 07/29/24 [id number]

## 2024-07-28 NOTE — ED Notes (Addendum)
 Pt arrived to [facility] from [facility]. ST elevation in II, III, and avF. Pt reports that his pain started at 0400 this morning and got increasingly worse throughout the day. Denies N/V. 2/10 chest pain and SOB. A&Ox4.   Received 4000u of Heparin , 4mg  Morphine , 324mg  ASA, 1 nitroglycerin  at [facility]

## 2024-07-28 NOTE — Progress Notes (Signed)
 eLink Physician-Brief Progress Note Patient Name: Aaron Avila DOB: 15-Mar-1971 MRN: [id number]   Date of Service  07/28/2024  HPI/Events of Note  11 M smoker, with significant family history of CAD (father MI at 67) presented to [facility] ED on 9/22 with chest pain. EKG with STEMI II, III, AVF. Transferred to [facility] for LHC via right radial with successful PCI to RCA.  eICU Interventions  Post cath care On aspirin , prasugrel , statin, beta blocker Risk stratification Smoking cessation counselling needed Formal CCM referral if assistance needed     Intervention Category Evaluation Type: New Patient Evaluation  Aaron Avila 07/28/2024, 11:34 PM

## 2024-07-28 NOTE — ED Provider Notes (Signed)
 [place] [facility]ERGENC[facility] DEPARTMENT AT [facility] Provider Note   CSN: [id number] Arrival date & time: 07/28/24  2009     Patient presents with: Chest Pain   Aaron Avila is a 53 y.o. male with PMHX significant for family history significant for his father passing away from a heart attack at age 74 presents to the ED for complaints of chest pain. Patient reports no chronic medical conditions but states the chest pain started this morning and was initially intermittent. He went to work and After he got off work, he stated the pain has become constant, more severe and feels like an elephant is sitting on my chest. He also states the pain wraps around to his back. He reports only taking tylenol  for the pain today. Patient also reports shortness of breath. Does not take daily ASA. Only other significant medical history is smoking x1ppd.     Chest Pain      Prior to Admission medications   Medication Sig Start Date End Date Taking? Authorizing Provider  ibuprofen (ADVIL,MOTRIN) 200 MG tablet Take 800 mg by mouth every 6 (six) hours as needed.    [provider]    Allergies: Patient has no known allergies.    Review of Systems  Cardiovascular:  Positive for chest pain.    Updated Vital Signs BP (!) 142/106   Pulse 89   Temp 98.2 F (36.8 C) (Oral)   Resp 18   Ht 6' (1.829 m)   Wt 108.9 kg   SpO2 95%   BMI 32.55 kg/m   Physical Exam Vitals and nursing note reviewed.  Constitutional:      Appearance: Normal appearance.  HENT:     Head: Normocephalic and atraumatic.     Mouth/Throat:     Mouth: Mucous membranes are moist.  Eyes:     General: No scleral icterus.       Right eye: No discharge.        Left eye: No discharge.     Conjunctiva/sclera: Conjunctivae normal.  Cardiovascular:     Rate and Rhythm: Normal rate and regular rhythm.     Pulses: Normal pulses.     Comments: Patient in visible distress on exam. No diaphoresis. Obvious shortness of  breath. Good peripheral pulses. No edema Pulmonary:     Effort: Pulmonary effort is normal.     Breath sounds: Normal breath sounds.  Abdominal:     General: There is no distension.     Tenderness: There is no abdominal tenderness.  Musculoskeletal:        General: No deformity.     Cervical back: Normal range of motion.  Skin:    General: Skin is warm and dry.     Capillary Refill: Capillary refill takes less than 2 seconds.  Neurological:     Mental Status: He is alert.     Motor: No weakness.  Psychiatric:        Mood and Affect: Mood normal.     (all labs ordered are listed, but only abnormal results are displayed) Labs Reviewed  CBC WITH DIFFERENTIAL/PLATELET - Abnormal; Notable for the following components:      Result Value   WBC 12.3 (*)    All other components within normal limits  COMPREHENSIVE METABOLIC PANEL WITH GFR - Abnormal; Notable for the following components:   Glucose, Bld 215 (*)    Calcium  8.8 (*)    All other components within normal limits  LIPID PANEL - Abnormal;  Notable for the following components:   Cholesterol 305 (*)    Triglycerides 179 (*)    LDL Cholesterol 221 (*)    All other components within normal limits  LACTIC ACID, PLASMA - Abnormal; Notable for the following components:   Lactic Acid, Venous 2.3 (*)    All other components within normal limits  TROPONIN I (HIGH SENSITIVITY) - Abnormal; Notable for the following components:   Troponin I (High Sensitivity) 960 (*)    All other components within normal limits  PROTIME-INR  APTT  HEMOGLOBIN A1C  TROPONIN I (HIGH SENSITIVITY)  TROPONIN I (HIGH SENSITIVITY)    EKG: None  Radiology: DG Chest 2 View Result Date: 07/28/2024 CLINICAL DATA:  Chest pain EXAM: CHEST - 2 VIEW COMPARISON:  CT chest 08/30/2022 FINDINGS: Emphysematous changes in the lungs. Lungs appear clear and expanded. No focal consolidation or airspace disease. Heart size and pulmonary vascularity are normal.  Mediastinal contours appear intact. Postoperative changes in the cervical spine. No pleural effusion or pneumothorax. IMPRESSION: Emphysematous changes in the lungs. No evidence of active pulmonary disease. Electronically Signed   By: Elsie Gravely M.D.   On: 07/28/2024 21:01    .Critical Care  Performed by: Torrence Marry RAMAN, PA-C Authorized by: Torrence Marry RAMAN, PA-C   Critical care provider statement:    Critical care time (minutes):  34   Critical care was necessary to treat or prevent imminent or life-threatening deterioration of the following conditions:  Cardiac failure   Critical care was time spent personally by me on the following activities:  Development of treatment plan with patient or surrogate, discussions with consultants, evaluation of patient's response to treatment, examination of patient, ordering and review of laboratory studies, ordering and review of radiographic studies, ordering and performing treatments and interventions, pulse oximetry, re-evaluation of patient's condition, review of old charts, blood draw for specimens and interpretation of cardiac output measurements   Care discussed with: admitting provider      Medications Ordered in the ED  0.9 %  sodium chloride  infusion ( Intravenous New Bag/Given 07/28/24 2051)  nitroGLYCERIN  (NITROSTAT ) SL tablet 0.4 mg ( Sublingual MAR Hold 07/28/24 2141)  Heparin  (Porcine) in NaCl 1000-0.9 UT/500ML-% SOLN (500 mLs  Given 07/28/24 2137)  midazolam  (VERSED ) injection (2 mg Intravenous Given 07/28/24 2205)  fentaNYL  (SUBLIMAZE ) injection (50 mcg Intravenous Given 07/28/24 2205)  heparin  sodium (porcine) injection (3,000 Units Intravenous Given 07/28/24 2233)  prasugrel  (EFFIENT ) tablet (60 mg Oral Given 07/28/24 2205)  nitroGLYCERIN  100 mcg/mL intra-arterial injection (has no administration in time range)  Radial Cocktail/Verapamil  only (10 mLs Intra-arterial Given 07/28/24 2154)  verapamil  (ISOPTIN ) injection (200 mcg  Intracoronary Given 07/28/24 2227)  iohexol  (OMNIPAQUE ) 350 MG/ML injection (150 mLs  Given 07/28/24 2239)  morphine  (PF) 4 MG/ML injection 4 mg (4 mg Intravenous Given 07/28/24 2042)  aspirin  EC tablet 325 mg (325 mg Oral Not Given 07/28/24 2051)  nitroGLYCERIN  (NITROSTAT ) SL tablet 0.4 mg (0.4 mg Sublingual Given 07/28/24 2050)  aspirin  chewable tablet 324 mg (324 mg Oral Given 07/28/24 2050)  heparin  injection 4,000 Units (4,000 Units Intravenous Given 07/28/24 2046)                                Medical Decision Making Amount and/or Complexity of Data Reviewed Labs: ordered. Radiology: ordered.  Risk OTC drugs. Prescription drug management. Decision regarding hospitalization.   This patient presents to the ED for concern of chest pain  and shortness of breath, this involves an extensive number of treatment options, and is a complaint that carries with it a high risk of complications and morbidity.  Differential diagnosis includes: STEMI, ACS, GERD, pericarditis, aortic dissection, pulmonary embolism, tension pneumothorax.  Co morbidities: family history positive for heart disease on paternal side    Lab Tests:  I Ordered, and personally interpreted labs.  The pertinent results include:    - troponin: 960 - Lactic acid: 2.3 - WBC: 12.3  Imaging Studies:  I ordered imaging studies including chest xray. I independently visualized and interpreted imaging which showed no acute abnormalities. I agree with the radiologist interpretation  Cardiac Monitoring/ECG:  The patient was maintained on a cardiac monitor.  I personally viewed and interpreted the cardiac monitored which showed an underlying rhythm of: sinus tachycardia with multiple ST elevations noted on bedside monitor. Code STEMI activated at 20:39.  Medicines ordered and prescription drug management:  I ordered medication including  Medications  0.9 %  sodium chloride  infusion ( Intravenous New Bag/Given 07/28/24 2051)   nitroGLYCERIN  (NITROSTAT ) SL tablet 0.4 mg ( Sublingual MAR Hold 07/28/24 2141)  Heparin  (Porcine) in NaCl 1000-0.9 UT/500ML-% SOLN (500 mLs  Given 07/28/24 2137)  midazolam  (VERSED ) injection (2 mg Intravenous Given 07/28/24 2205)  fentaNYL  (SUBLIMAZE ) injection (50 mcg Intravenous Given 07/28/24 2205)  heparin  sodium (porcine) injection (3,000 Units Intravenous Given 07/28/24 2233)  prasugrel  (EFFIENT ) tablet (60 mg Oral Given 07/28/24 2205)  nitroGLYCERIN  100 mcg/mL intra-arterial injection (has no administration in time range)  Radial Cocktail/Verapamil  only (10 mLs Intra-arterial Given 07/28/24 2154)  verapamil  (ISOPTIN ) injection (200 mcg Intracoronary Given 07/28/24 2227)  iohexol  (OMNIPAQUE ) 350 MG/ML injection (150 mLs  Given 07/28/24 2239)  morphine  (PF) 4 MG/ML injection 4 mg (4 mg Intravenous Given 07/28/24 2042)  aspirin  EC tablet 325 mg (325 mg Oral Not Given 07/28/24 2051)  nitroGLYCERIN  (NITROSTAT ) SL tablet 0.4 mg (0.4 mg Sublingual Given 07/28/24 2050)  aspirin  chewable tablet 324 mg (324 mg Oral Given 07/28/24 2050)  heparin  injection 4,000 Units (4,000 Units Intravenous Given 07/28/24 2046)   for STEMI activation Patient was transferred to [facility] for further evaluation, heart catheterization and continued management. I have reviewed the patients home medicines and have made adjustments as needed  Critical Interventions:  STEMI activation - Transfer to [facility] - Oxygen therapy - aspirin , nitroglycerin  - Cardiac monitoring - Consult to cardiology - Heparin   Consultations Obtained: - Cardiology  Problem List / ED Course:     ICD-10-CM   1. ST elevation myocardial infarction (STEMI), unspecified artery (HCC)  I21.3       MDM: Patient is a 53 y/o male who presents for worsening chest pain throughout the day. He describes the pain as an elephant sitting on my chest. No diaphoresis. Initial EKG shows mild ST elevation in lead II and aVF. Repeat EKG shows multiple ST  elevations in multiple leads. STEMI activated at 20:39. Patient given morphine , nitroglycerin , ASA 325. Heparin  drip started. Troponin 960. Lactic acid 2.3. WBCs 12.3. Consult to cardiology placed. Cardiology doctor is Dr. Barbette. Transport being arranged for transfer to [facility] for heart catheterization. Patient is critically ill.   Dispostion:  After consideration of the diagnostic results and the patients response to treatment, I feel that the patient would benefit from transfer to [facility] for heart catheterization.   Final diagnoses:  ST elevation myocardial infarction (STEMI), unspecified artery [facility])    ED Discharge Orders     None  Torrence Marry GORMAN DEVONNA 07/28/24 2248    Cleotilde Rogue, MD 07/29/24 [phone]

## 2024-07-28 NOTE — ED Notes (Signed)
-  EMS enroute to [facility] ED.

## 2024-07-28 NOTE — ED Notes (Addendum)
-  Called [facility] to activate [facility], advised to call EMS for transport due to no trucks.  -[employer] for transportation to [facility].

## 2024-07-28 NOTE — ED Notes (Signed)
 EMS at bedside

## 2024-07-28 NOTE — ED Triage Notes (Signed)
 Pt reports waking up to substernal chest pressure that has come and gone all day, consistent for the past few hours.  No hx, feels like an elephant sitting on my chest.

## 2024-07-28 NOTE — ED Triage Notes (Addendum)
 Pt arrived to [facility] from [facility]. ST elevation in II, III, and avF. Pt reports that his pain started at 0400 this morning and got increasingly worse throughout the day. Denies N/V. 2/10 chest pain and SOB. A&Ox4.   Received 4000u of Heparin , 4mg  Morphine , 324mg  ASA, 1 nitroglycerin  at [facility]

## 2024-07-28 NOTE — H&P (Signed)
 Cardiology Admission History and Physical   Patient ID: UDELL MAZZOCCO MRN: [id number]; DOB: 1971-03-16   Admission date: 07/28/2024  PCP:  Aaron Lenis, MD   [facility] Providers Cardiologist:  None       Chief Complaint:  chest pain  Patient Profile: Aaron Avila is a 53 y.o. male with obesity and tobacco use who is being seen 07/28/2024 for the evaluation of chest pain and inferolateral STEMI.  History of Present Illness: Aaron Avila presented to the ED at [facility] after episodes of pressure-like (feels like elephant sitting on the chest) chest pain that started this morning and was initially intermittent. He came back from work at around 5 PM and then the patient became constant and more severe, which prompted his visit to the ED. Chest pain radiates to the back and has been associated with SOB but no n/v or diaphoresis.He also reports that for about 2-3 weeks, he's been having increased fatigue and DOE.  At [facility], his EKG showed inferolateral STEMI. He remained hemodynamically stable. He received ASA 324 mg and heparin  4000 units and was subsequently transferred to [facility] as STEMI activation for emergent cath.    History reviewed. No pertinent past medical history. Past Surgical History:  Procedure Laterality Date   CERVICAL SPINE SURGERY     COLONOSCOPY WITH PROPOFOL  N/A 03/09/2023   Procedure: COLONOSCOPY WITH PROPOFOL ;  Surgeon: Onita Elspeth Sharper, DO;  Location: [facility] ENDOSCOPY;  Service: Gastroenterology;  Laterality: N/A;     Medications Prior to Admission: Prior to Admission medications   Medication Sig Start Date End Date Taking? Authorizing Provider  ibuprofen (ADVIL,MOTRIN) 200 MG tablet Take 800 mg by mouth every 6 (six) hours as needed.    [provider]  naproxen  (NAPROSYN ) 500 MG tablet Take 1 tablet (500 mg total) by mouth 2 (two) times daily. Patient not taking: Reported on 03/09/2023 09/24/16   Bluford Rogue, MD  oxyCODONE -acetaminophen   (PERCOCET) 5-325 MG tablet Take 1-2 tablets by mouth every 4 (four) hours as needed. Patient not taking: Reported on 03/09/2023 09/24/16   Bluford Rogue, MD  predniSONE  (DELTASONE ) 50 MG tablet 1 tablet daily for 7 days Patient not taking: Reported on 03/09/2023 09/24/16   Bluford Rogue, MD     Allergies:   No Known Allergies  Social History:   Social History   Socioeconomic History   Marital status: Single    Spouse name: Not on file   Number of children: Not on file   Years of education: Not on file   Highest education level: Not on file  Occupational History   Not on file  Tobacco Use   Smoking status: Every Day    Current packs/day: 1.00    Types: Cigarettes   Smokeless tobacco: Never  Vaping Use   Vaping status: Never Used  Substance and Sexual Activity   Alcohol use: Yes    Comment: occ   Drug use: Yes    Types: Marijuana    Comment: 2 MONTHS AGO   Sexual activity: Not on file  Other Topics Concern   Not on file  Social History Narrative   Not on file   Social Drivers of Health   Financial Resource Strain: Low Risk  (12/18/2023)   Received from [facility]   Overall Financial Resource Strain (CARDIA)    Difficulty of Paying Living Expenses: Not very hard  Food Insecurity: No Food Insecurity (12/18/2023)   Received from [facility]   Hunger  Vital Sign    Within the past 12 months, you worried that your food would run out before you got the money to buy more.: Never true    Within the past 12 months, the food you bought just didn't last and you didn't have money to get more.: Never true  Transportation Needs: No Transportation Needs (12/18/2023)   Received from [facility] - Transportation    In the past 12 months, has lack of transportation kept you from medical appointments or from getting medications?: No    Lack of Transportation (Non-Medical): No  Physical Activity: Not on file  Stress: Not on file   Social Connections: Not on file  Intimate Partner Violence: Not on file     Family History:   Father died in his 29s of a heart attack.  ROS:  Please see the history of present illness.  All other ROS reviewed and negative.     Physical Exam/Data: Vitals:   07/28/24 2045 07/28/24 2130 07/28/24 2130 07/28/24 2131  BP: (!) 187/108 (!) 142/106    Pulse: 89 95  89  Resp: 12 16  18   Temp:   98.2 F (36.8 C)   TempSrc:   Oral   SpO2: 97% 99%  99%  Weight:      Height:       No intake or output data in the 24 hours ending 07/28/24 2136    07/28/2024    8:19 PM 03/09/2023    8:51 AM 09/24/2016    9:45 AM  Last 3 Weights  Weight (lbs) 240 lb 236 lb 7.8 oz 240 lb  Weight (kg) 108.863 kg 107.27 kg 108.863 kg     Body mass index is 32.55 kg/m.  General:  Well nourished, well developed, in no acute distress HEENT: Xanthelasma  Neck: no JVD Vascular: No carotid bruits; Distal pulses 2+ bilaterally   Cardiac:  normal S1, S2; RRR; no murmur Lungs:  clear to auscultation bilaterally, no wheezing, rhonchi or rales  Abd: soft, nontender, no hepatomegaly  Ext: no edema Musculoskeletal:  No deformities, BUE and BLE strength normal and equal Skin: warm and dry  Neuro:  CNs 2-12 intact, no focal abnormalities noted Psych:  Normal affect   EKG:  The ECG that was done 07/28/2024 was personally reviewed and demonstrates sinus rhythm with acute inferolateral STEMI  Relevant CV Studies: None  Laboratory Data: High Sensitivity Troponin:  No results for input(s): TROPONINIHS in the last 720 hours.    ChemistryNo results for input(s): NA, K, CL, CO2, GLUCOSE, BUN, CREATININE, CALCIUM , MG, GFRNONAA, GFRAA, ANIONGAP in the last 168 hours.  No results for input(s): PROT, ALBUMIN, AST, ALT, ALKPHOS, BILITOT in the last 168 hours. Lipids  Recent Labs  Lab 07/28/24 2039  CHOL 305*  TRIG 179*  HDL 48  LDLCALC 221*  CHOLHDL 6.4   Hematology Recent  Labs  Lab 07/28/24 2039  WBC 12.3*  RBC 4.71  HGB 14.7  HCT 43.5  MCV 92.4  MCH 31.2  MCHC 33.8  RDW 12.7  PLT 288   Thyroid No results for input(s): TSH, FREET4 in the last 168 hours. BNPNo results for input(s): BNP, PROBNP in the last 168 hours.  DDimer No results for input(s): DDIMER in the last 168 hours.  Radiology/Studies:  DG Chest 2 View Result Date: 07/28/2024 CLINICAL DATA:  Chest pain EXAM: CHEST - 2 VIEW COMPARISON:  CT chest 08/30/2022 FINDINGS: Emphysematous changes in the lungs. Lungs appear clear  and expanded. No focal consolidation or airspace disease. Heart size and pulmonary vascularity are normal. Mediastinal contours appear intact. Postoperative changes in the cervical spine. No pleural effusion or pneumothorax. IMPRESSION: Emphysematous changes in the lungs. No evidence of active pulmonary disease. Electronically Signed   By: Elsie Gravely M.D.   On: 07/28/2024 21:01     Assessment and Plan: Inferolateral STEMI Familial hyperlipidemia  Xanthelasma  - S/p ASA 325 mg; ASA 81 mg daily thereafter - S/p IV heparin  4000 units - P2Y12i in the cath lab - To the cath lab for emergent coronary angiogram and likely primary PCI - TTE tomorrow - Atorvastatin  40 mg daily - LDL 221; triglyceride 179, likely familial hyperlipidemia. Start atorvastatin  80 mg daily - Check A1C   Risk Assessment/Risk Scores:   TIMI Risk Score for ST  Elevation MI:   The patient's TIMI risk score is 0, which indicates a 0.8% risk of all cause mortality at 30 days.      Code Status: Full Code  Severity of Illness: The appropriate patient status for this patient is INPATIENT. Inpatient status is judged to be reasonable and necessary in order to provide the required intensity of service to ensure the patient's safety. The patient's presenting symptoms, physical exam findings, and initial radiographic and laboratory data in the context of their chronic comorbidities is felt  to place them at high risk for further clinical deterioration. Furthermore, it is not anticipated that the patient will be medically stable for discharge from the hospital within 2 midnights of admission.   * I certify that at the point of admission it is my clinical judgment that the patient will require inpatient hospital care spanning beyond 2 midnights from the point of admission due to high intensity of service, high risk for further deterioration and high frequency of surveillance required.*  For questions or updates, please contact [facility] Please consult [email or web address] for contact info under       Signed, Gillian CHRISTELLA Cass, MD  07/28/2024 9:36 PM

## 2024-07-29 ENCOUNTER — Inpatient Hospital Stay (HOSPITAL_COMMUNITY)

## 2024-07-29 ENCOUNTER — Encounter (HOSPITAL_COMMUNITY)
Admission: EM | Disposition: A | Discharge status: Home / Self Care | Payer: Self-pay | Source: Home / Self Care | Attending: Cardiovascular Disease

## 2024-07-29 ENCOUNTER — Telehealth (HOSPITAL_COMMUNITY): Payer: Self-pay

## 2024-07-29 ENCOUNTER — Encounter (HOSPITAL_COMMUNITY): Payer: Self-pay | Admitting: Cardiovascular Disease

## 2024-07-29 ENCOUNTER — Telehealth (HOSPITAL_COMMUNITY): Payer: Self-pay | Admitting: Pharmacy Technician

## 2024-07-29 ENCOUNTER — Other Ambulatory Visit (HOSPITAL_COMMUNITY): Payer: Self-pay

## 2024-07-29 DIAGNOSIS — I2111 ST elevation (STEMI) myocardial infarction involving right coronary artery: Principal | ICD-10-CM

## 2024-07-29 DIAGNOSIS — I2489 Other forms of acute ischemic heart disease: Secondary | ICD-10-CM | POA: Diagnosis not present

## 2024-07-29 DIAGNOSIS — I10 Essential (primary) hypertension: Secondary | ICD-10-CM

## 2024-07-29 DIAGNOSIS — E7801 Familial hypercholesterolemia: Secondary | ICD-10-CM | POA: Diagnosis not present

## 2024-07-29 DIAGNOSIS — F17210 Nicotine dependence, cigarettes, uncomplicated: Secondary | ICD-10-CM

## 2024-07-29 LAB — CBC
HCT: 39.8 % (ref 39.0–52.0)
Hemoglobin: 13.2 g/dL (ref 13.0–17.0)
MCH: 31.1 pg (ref 26.0–34.0)
MCHC: 33.2 g/dL (ref 30.0–36.0)
MCV: 93.9 fL (ref 80.0–100.0)
Platelets: 236 K/uL (ref 150–400)
RBC: 4.24 MIL/uL (ref 4.22–5.81)
RDW: 12.9 % (ref 11.5–15.5)
WBC: 10.6 K/uL — ABNORMAL HIGH (ref 4.0–10.5)
nRBC: 0 % (ref 0.0–0.2)

## 2024-07-29 LAB — TROPONIN I (HIGH SENSITIVITY): Troponin I (High Sensitivity): 5277 ng/L (ref <18)

## 2024-07-29 LAB — BASIC METABOLIC PANEL WITH GFR
Anion gap: 7 (ref 5–15)
BUN: 9 mg/dL (ref 6–20)
CO2: 26 mmol/L (ref 22–32)
Calcium: 8.7 mg/dL — ABNORMAL LOW (ref 8.9–10.3)
Chloride: 108 mmol/L (ref 98–111)
Creatinine, Ser: 0.77 mg/dL (ref 0.61–1.24)
GFR, Estimated: >60 mL/min (ref >60)
Glucose, Bld: 161 mg/dL — ABNORMAL HIGH (ref 70–99)
Potassium: 4.2 mmol/L (ref 3.5–5.1)
Sodium: 141 mmol/L (ref 135–145)

## 2024-07-29 LAB — HEMOGLOBIN A1C
Hgb A1c MFr Bld: 5.7 % — ABNORMAL HIGH (ref 4.8–5.6)
Mean Plasma Glucose: 116.89 mg/dL

## 2024-07-29 LAB — MRSA NEXT GEN BY PCR, NASAL: MRSA by PCR Next Gen: NOT DETECTED

## 2024-07-29 LAB — ECHOCARDIOGRAM COMPLETE
Area-P 1/2: 5.58 cm2
Height: 72 in
S' Lateral: 4.6 cm
Weight: 4024.72 [oz_av]

## 2024-07-29 SURGERY — CORONARY ANGIOGRAPHY (CATH LAB)
Anesthesia: LOCAL

## 2024-07-29 MED ORDER — LOSARTAN POTASSIUM 25 MG PO TABS
25.0000 mg | ORAL_TABLET | Freq: Every day | ORAL | Status: DC
  Administered 2024-07-29: 25 mg via ORAL
  Filled 2024-07-29: qty 1

## 2024-07-29 MED ORDER — VERAPAMIL HCL 2.5 MG/ML IV SOLN
INTRAVENOUS | Status: AC
  Filled 2024-07-29: qty 2

## 2024-07-29 MED ORDER — SPIRONOLACTONE 12.5 MG HALF TABLET
12.5000 mg | ORAL_TABLET | Freq: Every day | ORAL | Status: DC
  Administered 2024-07-29 – 2024-07-30 (×2): 12.5 mg via ORAL
  Filled 2024-07-29 (×2): qty 1

## 2024-07-29 MED ORDER — LIDOCAINE HCL (PF) 1 % IJ SOLN
INTRAMUSCULAR | Status: AC
  Filled 2024-07-29: qty 30

## 2024-07-29 MED ORDER — HEPARIN SODIUM (PORCINE) 1000 UNIT/ML IJ SOLN
INTRAMUSCULAR | Status: AC
  Filled 2024-07-29: qty 10

## 2024-07-29 MED ORDER — METOPROLOL SUCCINATE ER 25 MG PO TB24
25.0000 mg | ORAL_TABLET | Freq: Every day | ORAL | Status: DC
  Administered 2024-07-29 – 2024-07-30 (×2): 25 mg via ORAL
  Filled 2024-07-29 (×2): qty 1

## 2024-07-29 MED ORDER — PERFLUTREN LIPID MICROSPHERE
1.0000 mL | INTRAVENOUS | Status: AC | PRN
  Administered 2024-07-29: 2 mL via INTRAVENOUS

## 2024-07-29 MED ORDER — EMPAGLIFLOZIN 10 MG PO TABS
10.0000 mg | ORAL_TABLET | Freq: Every day | ORAL | Status: DC
  Administered 2024-07-29 – 2024-07-30 (×2): 10 mg via ORAL
  Filled 2024-07-29 (×2): qty 1

## 2024-07-29 MED ORDER — FREE WATER
500.0000 mL | Freq: Once | Status: AC
  Administered 2024-07-29: 500 mL via ORAL

## 2024-07-29 NOTE — Progress Notes (Signed)
 CARDIAC REHAB PHASE I   PRE:  Rate/Rhythm: 97 NSR  BP:  Sitting: 146/96      SpO2: 94 RA  MODE:  Ambulation: 370 ft    POST:  Rate/Rhythm: 106 ST   BP:  Sitting: In bathroom      SpO2: In bathroom  Pt amb with supervision assist in the hallway, pt walked well w/o chest pain. Pt helped to BR after.  KINNETH FUJIWARA  MS, ACSM-CEP 10:23 AM 07/29/2024

## 2024-07-29 NOTE — Progress Notes (Signed)
  Echocardiogram 2D Echocardiogram has been performed.  Tinnie FORBES Gosling RDCS 07/29/2024, 9:05 AM

## 2024-07-29 NOTE — Discharge Instructions (Signed)
 [employer]  Co-pay card: [email or web address] ______________________________________________________________________________ Information about your medication: Effient  (anti-platelet agent)  Generic Name (Brand): prasugrel  (Effient ), once daily medication  PURPOSE: You are taking this medication along with aspirin  to lower your chance of having a heart attack, stroke, or blood clots in your heart stent. These can be fatal. Brilinta and aspirin  help prevent platelets from sticking together and forming a clot that can block an artery or your stent.   Common SIDE EFFECTS you may experience include: bruising or bleeding more easily, shortness of breath  Do not stop taking EFFIENT  without talking to the doctor who prescribes it for you. People who are treated with a stent and stop taking Effient  too soon, have a higher risk of getting a blood clot in the stent, having a heart attack, or dying. If you stop Effient  because of bleeding, or for other reasons, your risk of a heart attack or stroke may increase.   Tell all of your doctors and dentists that you are taking Effient . They should talk to the doctor who prescribed Brilinta for you before you have any surgery or invasive procedure.   Contact your health care provider if you experience: severe or uncontrollable bleeding, pink/red/brown urine, vomiting blood or vomit that looks like coffee grounds, red or black stools (looks like tar), coughing up blood or blood clots ----------------------------------------------------------------------------------------------------------------------  Information about your medication: Statin (cholesterol-lowering agent)  Generic Name (Brand): atorvastatin  (Lipitor), pravastatin (Pravachol), rosuvastatin (Crestor), simvastatin (Zocor)  PURPOSE: You are taking this medication to lower your bad cholesterol (LDL) and to prevent heart attacks and strokes.  Statins can also raise your good cholesterol (HDL).  Common SIDE EFFECTS you may experience include: muscle pain or weakness (especially in the legs) and upset stomach.  Take your medication exactly as prescribed. Do not eat large amounts of grapefruit or grapefruit juice while taking this medication.  Contact your health care provider if you experience: severe muscle pain that does not improve, dark urine, or yellowing of your skin or eyes. ----------------------------------------------------------------------------------------------------------------------

## 2024-07-29 NOTE — Progress Notes (Addendum)
 Patient Name: Aaron Avila Date of Encounter: 07/29/2024 [facility] Cardiologist: None   Interval Summary  .    No chest pain Had no signs of radial cath site overnight necessitating reapplication of TR band with minimal air, also to come off.  Vital Signs .    Vitals:   07/29/24 0645 07/29/24 0700 07/29/24 0715 07/29/24 0800  BP: 131/88 123/85 (!) 144/88 134/78  Pulse: 92 92 84 80  Resp: (!) 22 20 20 16   Temp:  98.5 F (36.9 C)    TempSrc:  Oral    SpO2: 94% 95% 93% 93%  Weight:      Height:        Intake/Output Summary (Last 24 hours) at 07/29/2024 0829 Last data filed at 07/29/2024 0730 Gross per 24 hour  Intake 221.5 ml  Output 1450 ml  Net -1228.5 ml      07/28/2024   11:00 PM 07/28/2024    8:19 PM 03/09/2023    8:51 AM  Last 3 Weights  Weight (lbs) 251 lb 8.7 oz 240 lb 236 lb 7.8 oz  Weight (kg) 114.1 kg 108.863 kg 107.27 kg      Telemetry/ECG    07/29/2024 - Personally Reviewed Multiple episodes of NSVT up to 8 beats  EKG 07/30/2023: Sinus rhythm 86 bpm Inferior infarct, age-indeterminate  Echocardiogram 07/29/2024: Formal read pending, below is my read Very difficult images Severe inferior hypokinesis Best assessment of EF from 40-45% No significant valvular abnormality  Coronary intervention 07/28/2024: 1.  Acute inferior STEMI secondary to total occlusion of the distal RCA.  Successful PCI of the distal vessel culprit lesion using a 3.0 x 24 mm Synergy DES, treated 100% stenosis and TIMI 0 flow, reduced to 0% stenosis and TIMI-3 flow. 2.  Severe proximal RCA stenosis of 75% treated with a 3.5 x 24 mm Synergy DES, reducing percent stenosis to 0 and TIMI-3 flow post PCI 3.  Widely patent left main with no stenosis 4.  Patent LAD with moderate LAD/first diagonal bifurcation disease appropriate for medical therapy 5.  Patent circumflex and intermediate branches with no significant stenoses 6.  Severe hypokinesis of the inferior wall with  moderately reduced LVEF of 45%, moderately elevated LVEDP 28 mmHg   Recommendations: DAPT with aspirin  and Effient  x 12 months without interruption.  Effient  60 mg loaded orally on the table.  2D echocardiogram for reassessment of LVEF and presence of valvular disease.  Post MI medical therapy.     Physical Exam .   Physical Exam Vitals and nursing note reviewed.  Constitutional:      General: He is not in acute distress. Neck:     Vascular: No JVD.  Cardiovascular:     Rate and Rhythm: Normal rate and regular rhythm.     Heart sounds: Normal heart sounds. No murmur heard. Pulmonary:     Effort: Pulmonary effort is normal.     Breath sounds: Normal breath sounds. No wheezing or rales.  Musculoskeletal:     Right lower leg: No edema.     Left lower leg: No edema.      Assessment & Plan .     53 y/o male w/nicotine dependence, familial hypercholesterolemia, inferior STEMI  Inferior STEMI: Successful PCI w/Synergy 3.0 X 24 mm & 3.5 X 24 mm DES. LAD hard to lay out on angiogram due to overlap. Looks at least moderate eccetnric prox LAD disease with moderate calcification. Recommend RFR to LAD.  This afternoon and consider stenting  based on RFR results. Recommend 1 year DAPT with aspirin  and Effient . Start losartan  25 mg daily and metoprolol  succinate 25 mg daily. Continue Lipitor 80 mg daily. Patient very likely has familial hypercholesterolemia with LDL of 221, father with fatal MI at age 73. Will refer to lipid clinic for outpatient initiation of injectable agents.  Hypertension: Mildly elevated blood pressure, new finding.  Losartan  available succinate should suffice.  NSVT: I reviewed results of atrial fibrillation daily.  Nicotine dependence: Strongly emphasized complete cessation.  Patient wants to quit on his own.  Anticipated discharge: 07/31/2023  For questions or updates, please contact [facility] Please consult [email or web address] for contact info under         Signed, Newman JINNY Lawrence, MD    Addendum: EF 35-40%. Adding Jardiance  10 mg daily and spironolactone  12.5 mg daily.  Discussed w/Dr. [place] who is in the lab today. LAD felt to be too small and not supplying a lot of myocardium. Will treat medically for now. Plan for ischemia workup in the future only he has any recurrent chest pain. Will transfer to the floor and plan to discharge tomorrow.  Newman JINNY Lawrence, MD

## 2024-07-29 NOTE — Telephone Encounter (Signed)
 PHARMACIST LIPID MONITORING  Aaron Avila is a 53 y.o. y.o. adult admitted on 07/28/24 with STEMI.  Pharmacy has been consulted to optimize lipid-lowering therapy with the indication of secondary prevention for clinical ASCVD.   Recent Labs: Lipid Panel:     Component Value Date/Time   CHOL 305 (H) 07/28/2024 2039   TRIG 179 (H) 07/28/2024 2039   HDL 48 07/28/2024 2039   CHOLHDL 6.4 07/28/2024 2039   VLDL 36 07/28/2024 2039   LDLCALC 221 (H) 07/28/2024 2039    Hepatic function panel:     Component Value Date/Time   AST 34 07/28/2024 2039   ALT 22 07/28/2024 2039   ALKPHOS 66 07/28/2024 2039   BILITOT 0.5 07/28/2024 2039    Serum Creatinine  Creatinine, Ser (mg/dL)  Date Value  90/76/7974 0.77   Estimated CrCl of Estimated Creatinine Clearance: 137.7 mL/min (by C-G formula based on SCr of 0.77 mg/dL).  Current therapy and lipid therapy tolerance: Current lipid-lowering therapy: atorvastatin  80 mg  Previous lipid-lowering therapies (if applicable): n/a  Documented or reported allergies or intolerances to lipid-lowering therapies (if applicable): n/a    Assessment:    Patient is  Statin-naive Baseline LDL: 221 mg/dL Major ASCVD events: Recent ACS in past 12 months  High-Risk Conditions: Current smoker  Plan:    Statin intensity: Add or increase statin to high intensity. Refer to lipid clinic:   Yes

## 2024-07-29 NOTE — Telephone Encounter (Signed)
 Patient Product/process development scientist completed.    The patient is insured through [facility]. Patient has [employer], may use a copay card, and/or apply for patient assistance if available.    Ran test claim for prasugrel  10 mg and the current 30 day co-pay is $10.00.  Ran test claim for Farxiga 10 mg and the current 30 day co-pay is $100.00.  Ran test claim for Jardiance  10 mg and the current 30 day co-pay is $100.00.  This test claim was processed through [facility]- copay amounts may vary at other pharmacies due to pharmacy/plan contracts, or as the patient moves through the different stages of their insurance plan.     Reyes Sharps, CPHT Pharmacy Technician III Certified Patient Advocate [facility] Patient Advocate Team Direct Number: [phone]  Fax: [phone]

## 2024-07-30 ENCOUNTER — Other Ambulatory Visit (HOSPITAL_COMMUNITY): Payer: Self-pay

## 2024-07-30 ENCOUNTER — Telehealth (HOSPITAL_COMMUNITY): Payer: Self-pay | Admitting: Pharmacy Technician

## 2024-07-30 ENCOUNTER — Telehealth: Payer: Self-pay | Admitting: Cardiology

## 2024-07-30 DIAGNOSIS — I255 Ischemic cardiomyopathy: Secondary | ICD-10-CM | POA: Diagnosis not present

## 2024-07-30 DIAGNOSIS — E7801 Familial hypercholesterolemia: Secondary | ICD-10-CM | POA: Diagnosis not present

## 2024-07-30 DIAGNOSIS — I2111 ST elevation (STEMI) myocardial infarction involving right coronary artery: Secondary | ICD-10-CM | POA: Diagnosis not present

## 2024-07-30 DIAGNOSIS — F17200 Nicotine dependence, unspecified, uncomplicated: Secondary | ICD-10-CM | POA: Insufficient documentation

## 2024-07-30 DIAGNOSIS — F17210 Nicotine dependence, cigarettes, uncomplicated: Secondary | ICD-10-CM | POA: Diagnosis not present

## 2024-07-30 MED ORDER — EMPAGLIFLOZIN 10 MG PO TABS
10.0000 mg | ORAL_TABLET | Freq: Every day | ORAL | 3 refills | Status: AC
  Filled 2024-07-30: qty 90, 90d supply, fill #0

## 2024-07-30 MED ORDER — NITROGLYCERIN 0.4 MG SL SUBL
0.4000 mg | SUBLINGUAL_TABLET | SUBLINGUAL | 3 refills | Status: AC | PRN
  Filled 2024-07-30: qty 25, 8d supply, fill #0

## 2024-07-30 MED ORDER — PRASUGREL HCL 10 MG PO TABS
10.0000 mg | ORAL_TABLET | Freq: Every day | ORAL | 3 refills | Status: AC
  Filled 2024-07-30: qty 90, 90d supply, fill #0

## 2024-07-30 MED ORDER — SACUBITRIL-VALSARTAN 24-26 MG PO TABS
1.0000 | ORAL_TABLET | Freq: Two times a day (BID) | ORAL | 11 refills | Status: DC
  Filled 2024-07-30: qty 60, 30d supply, fill #0

## 2024-07-30 MED ORDER — SPIRONOLACTONE 25 MG PO TABS
12.5000 mg | ORAL_TABLET | Freq: Every day | ORAL | 3 refills | Status: DC
  Filled 2024-07-30: qty 45, 90d supply, fill #0

## 2024-07-30 MED ORDER — ASPIRIN 81 MG PO TBEC
81.0000 mg | DELAYED_RELEASE_TABLET | Freq: Every day | ORAL | 3 refills | Status: AC
  Filled 2024-07-30: qty 90, 90d supply, fill #0

## 2024-07-30 MED ORDER — METOPROLOL SUCCINATE ER 25 MG PO TB24
25.0000 mg | ORAL_TABLET | Freq: Every day | ORAL | 3 refills | Status: AC
  Filled 2024-07-30: qty 90, 90d supply, fill #0

## 2024-07-30 MED ORDER — ATORVASTATIN CALCIUM 80 MG PO TABS
80.0000 mg | ORAL_TABLET | Freq: Every day | ORAL | 3 refills | Status: AC
  Filled 2024-07-30: qty 90, 90d supply, fill #0

## 2024-07-30 MED ORDER — SACUBITRIL-VALSARTAN 24-26 MG PO TABS
1.0000 | ORAL_TABLET | Freq: Two times a day (BID) | ORAL | Status: DC
  Administered 2024-07-30: 1 via ORAL
  Filled 2024-07-30: qty 1

## 2024-07-30 NOTE — Discharge Summary (Signed)
 Discharge Summary   Patient ID: Aaron Avila MRN: [id number]; DOB: [birth year]  Admit date: 07/28/2024 Discharge date: 07/30/2024  PCP:  Glover Lenis, MD   [facility] Providers Cardiologist:  Newman JINNY Lawrence, MD   (New)    Discharge Diagnoses  Principal Problem:   STEMI involving right coronary artery [facility]) Active Problems:   ST elevation myocardial infarction (STEMI) [facility])   Ischemic cardiomyopathy   Nicotine dependence   Familial hypercholesterolemia   Diagnostic Studies/Procedures  Echocardiogram 07/29/2024: Formal read pending, below is my read Very difficult images Severe inferior hypokinesis Best assessment of EF from 40-45% No significant valvular abnormality   Coronary intervention 07/28/2024: 1.  Acute inferior STEMI secondary to total occlusion of the distal RCA.  Successful PCI of the distal vessel culprit lesion using a 3.0 x 24 mm Synergy DES, treated 100% stenosis and TIMI 0 flow, reduced to 0% stenosis and TIMI-3 flow. 2.  Severe proximal RCA stenosis of 75% treated with a 3.5 x 24 mm Synergy DES, reducing percent stenosis to 0 and TIMI-3 flow post PCI 3.  Widely patent left main with no stenosis 4.  Patent LAD with moderate LAD/first diagonal bifurcation disease appropriate for medical therapy 5.  Patent circumflex and intermediate branches with no significant stenoses 6.  Severe hypokinesis of the inferior wall with moderately reduced LVEF of 45%, moderately elevated LVEDP 28 mmHg   Recommendations: DAPT with aspirin  and Effient  x 12 months without interruption.  Effient  60 mg loaded orally on the table.  2D echocardiogram for reassessment of LVEF and presence of valvular disease.  Post MI medical therapy.   _____________   History of Present Illness   Aaron Avila is a 53 y.o. male with 53 y/o male w/nicotine dependence, familial hypercholesterolemia, inferior STEMI    Hospital Course   Consultants: None  Inferior STEMI: Successful  PCI w/Synergy 3.0 X 24 mm & 3.5 X 24 mm DES to prox-mid RCA. Calcific moderate disease in proximal LAD, but appears to be small does not reach apex.  Appropriate to treat medically. Recommend 1 year DAPT with aspirin  and Effient . Patient very likely has familial hypercholesterolemia with LDL of 221, father with fatal MI at age 15. In addition to Lipitor 80 mg daily now, will refer to lipid clinic for outpatient initiation of injectable agents. Significant symptomatic management.  Ischemic cardiomyopathy: Severe RCA territory hypokinesis with LVEF 35-40%. Started Entresto  24-26 mg twice daily continue metoprolol  succinate 25 mg daily, Jardiance  10 mg daily, spironolactone  daily. Repeat echocardiogram in 3 months.  NSVT: Optimize medical management of ischemic cardiomyopathy.  No indication for LifeVest.   Hypertension: Mildly elevated blood pressure, new finding.  Now improved with above medications.    Nicotine dependence: Strongly emphasized complete cessation.  Patient wants to quit on his own.  May need outpatient evaluation for COPD.      Did the patient have an acute coronary syndrome (MI, NSTEMI, STEMI, etc) this admission?:  Yes                               AHA/ACC ACS Clinical Performance & Quality Measures: Aspirin  prescribed? - Yes ADP Receptor Inhibitor (Plavix/Clopidogrel, Brilinta/Ticagrelor or Effient /Prasugrel ) prescribed (includes medically managed patients)? - Yes Beta Blocker prescribed? - Yes High Intensity Statin (Lipitor 40-80mg  or Crestor 20-40mg ) prescribed? - Yes EF assessed during THIS hospitalization? - Yes For EF <40%, was ACEI/ARB prescribed? - Yes For EF <40%, Aldosterone Antagonist (Spironolactone   or Eplerenone) prescribed? - Yes Cardiac Rehab Phase II ordered (including medically managed patients)? - Yes       The patient will be scheduled for a TOC follow up appointment in 14 days.  A message has been sent to the [facility] and Scheduling Pool at  the office where the patient should be seen for follow up.  _____________  Discharge Vitals Blood pressure 118/79, pulse 88, temperature 98.6 F (37 C), temperature source Oral, resp. rate 18, height 6' (1.829 m), weight 114.1 kg, SpO2 91%.  Filed Weights   07/28/24 2019 07/28/24 2300  Weight: 108.9 kg 114.1 kg   Physical Exam Vitals and nursing note reviewed.  Constitutional:      General: He is not in acute distress. Neck:     Vascular: No JVD.  Cardiovascular:     Rate and Rhythm: Normal rate and regular rhythm.     Heart sounds: Normal heart sounds. No murmur heard. Pulmonary:     Effort: Pulmonary effort is normal.     Breath sounds: Normal breath sounds. No wheezing or rales.     Comments: Mild wheezing, appears to be baseline, possibly undiagnosed COPD No hypoxia on ambulation Musculoskeletal:     Right lower leg: No edema.     Left lower leg: No edema.      Labs & Radiologic Studies  CBC Recent Labs    07/28/24 2039 07/28/24 2207 07/29/24 0225  WBC 12.3*  --  10.6*  NEUTROABS 7.5  --   --   HGB 14.7 12.6* 13.2  HCT 43.5 37.0* 39.8  MCV 92.4  --  93.9  PLT 288  --  236   Basic Metabolic Panel Recent Labs    90/77/74 2039 07/28/24 2207 07/29/24 0225  NA 141 142 141  K 4.0 4.2 4.2  CL 103 105 108  CO2 24  --  26  GLUCOSE 215* 133* 161*  BUN 16 14 9   CREATININE 0.82 0.80 0.77  CALCIUM  8.8*  --  8.7*   Liver Function Tests Recent Labs    07/28/24 2039  AST 34  ALT 22  ALKPHOS 66  BILITOT 0.5  PROT 7.3  ALBUMIN 3.7   No results for input(s): LIPASE, AMYLASE in the last 72 hours. High Sensitivity Troponin:   Recent Labs  Lab 07/28/24 2039 07/28/24 2355  TROPONINIHS 960* 5,277*    No results for input(s): TRNPT in the last 720 hours.  BNP Invalid input(s): POCBNP No results for input(s): PROBNP in the last 72 hours.  No results for input(s): BNP in the last 72 hours.  D-Dimer No results for input(s): DDIMER in the last  72 hours. Hemoglobin A1C Recent Labs    07/28/24 2039  HGBA1C 5.7*   Fasting Lipid Panel Recent Labs    07/28/24 2039  CHOL 305*  HDL 48  LDLCALC 221*  TRIG 179*  CHOLHDL 6.4   No results found for: LIPOA  Thyroid Function Tests No results for input(s): TSH, T4TOTAL, T3FREE, THYROIDAB in the last 72 hours.  Invalid input(s): FREET3 _____________  ECHOCARDIOGRAM COMPLETE Result Date: 07/29/2024    ECHOCARDIOGRAM REPORT   Patient Name:   VARTAN KERINS Date of Exam: 07/29/2024 Medical Rec #:  [id number]    Height:       72.0 in Accession #:    [id number]   Weight:       251.5 lb Date of Birth:  May 25, 1971   BSA:  2.349 m Patient Age:    52 years     BP:           134/78 mmHg Patient Gender: M            HR:           83 bpm. Exam Location:  Inpatient Procedure: 2D Echo, Color Doppler, Cardiac Doppler and Intracardiac            Opacification Agent (Both Spectral and Color Flow Doppler were            utilized during procedure). Indications:    Acute Ischemic Heart Disease, unspecified I24.9  History:        Patient has no prior history of Echocardiogram examinations.  Sonographer:    Tinnie Gosling RDCS Referring Phys: [phone] MICHAEL COOPER  Sonographer Comments: Technically difficult study due to poor echo windows. Image acquisition challenging due to patient body habitus and Image acquisition challenging due to respiratory motion. IMPRESSIONS  1. Left ventricular ejection fraction, by estimation, is 35 to 40%. The left ventricle has moderately decreased function. The left ventricle demonstrates regional wall motion abnormalities (see scoring diagram/findings for description). Left ventricular  diastolic parameters were normal. LAD and RCA territory wall motion abnormalities; not well visualized in all views basal function is preserved.  2. Right ventricular systolic function is normal. The right ventricular size is normal. Tricuspid regurgitation signal is inadequate for  assessing PA pressure.  3. The mitral valve was not well visualized. No evidence of mitral valve regurgitation. No evidence of mitral stenosis.  4. The aortic valve was not well visualized. Aortic valve regurgitation is not visualized. No aortic stenosis is present.  5. The inferior vena cava is dilated in size with >50% respiratory variability, suggesting right atrial pressure of 8 mmHg. Comparison(s): No prior Echocardiogram. FINDINGS  Left Ventricle: Left ventricular ejection fraction, by estimation, is 35 to 40%. The left ventricle has moderately decreased function. The left ventricle demonstrates regional wall motion abnormalities. The left ventricular internal cavity size was normal in size. There is no left ventricular hypertrophy. Left ventricular diastolic parameters were normal.  LV Wall Scoring: The mid and distal anterior wall, entire inferior wall, mid anteroseptal segment, and mid inferolateral segment are hypokinetic. The basal anteroseptal segment, basal inferolateral segment, and basal anterior segment are normal. LAD and RCA territory wall motion abnormalities; not well visualized in all views basal function is preserved. Right Ventricle: The right ventricular size is normal. No increase in right ventricular wall thickness. Right ventricular systolic function is normal. Tricuspid regurgitation signal is inadequate for assessing PA pressure. Left Atrium: Left atrial size was normal in size. Right Atrium: Right atrial size was normal in size. Pericardium: Trivial pericardial effusion is present. Mitral Valve: The mitral valve was not well visualized. No evidence of mitral valve regurgitation. No evidence of mitral valve stenosis. Tricuspid Valve: The tricuspid valve is normal in structure. Tricuspid valve regurgitation is not demonstrated. No evidence of tricuspid stenosis. Aortic Valve: The aortic valve was not well visualized. Aortic valve regurgitation is not visualized. No aortic stenosis is  present. Pulmonic Valve: The pulmonic valve was not well visualized. Pulmonic valve regurgitation is not visualized. No evidence of pulmonic stenosis. Aorta: The aortic root is normal in size and structure. Venous: The inferior vena cava is dilated in size with greater than 50% respiratory variability, suggesting right atrial pressure of 8 mmHg. IAS/Shunts: No atrial level shunt detected by color flow Doppler.  LEFT VENTRICLE PLAX 2D LVIDd:  5.30 cm   Diastology LVIDs:         4.60 cm   LV e' medial:    7.83 cm/s LV PW:         1.20 cm   LV E/e' medial:  8.6 LV IVS:        1.10 cm   LV e' lateral:   8.38 cm/s LVOT diam:     2.50 cm   LV E/e' lateral: 8.0 LV SV:         64 LV SV Index:   27 LVOT Area:     4.91 cm  RIGHT VENTRICLE             IVC RV S prime:     13.40 cm/s  IVC diam: 2.10 cm TAPSE (M-mode): 1.8 cm LEFT ATRIUM           Index       RIGHT ATRIUM           Index LA diam:      3.80 cm 1.62 cm/m  RA Area:     15.10 cm LA Vol (A4C): 20.8 ml 8.86 ml/m  RA Volume:   36.90 ml  15.71 ml/m  AORTIC VALVE LVOT Vmax:   73.90 cm/s LVOT Vmean:  48.700 cm/s LVOT VTI:    0.131 m  AORTA Ao Root diam: 3.20 cm MITRAL VALVE MV Area (PHT): 5.58 cm    SHUNTS MV Decel Time: 136 msec    Systemic VTI:  0.13 m MV E velocity: 67.40 cm/s  Systemic Diam: 2.50 cm MV A velocity: 91.70 cm/s MV E/A ratio:  0.74 Stanly Leavens MD Electronically signed by Stanly Leavens MD Signature Date/Time: 07/29/2024/10:37:35 AM    Final    CARDIAC CATHETERIZATION Result Date: 07/28/2024   LV end diastolic pressure is moderately elevated.   The left ventricular ejection fraction is 45-50% by visual estimate. 1.  Acute inferior STEMI secondary to total occlusion of the distal RCA.  Successful PCI of the distal vessel culprit lesion using a 3.0 x 24 mm Synergy DES, treated 100% stenosis and TIMI 0 flow, reduced to 0% stenosis and TIMI-3 flow. 2.  Severe proximal RCA stenosis of 75% treated with a 3.5 x 24 mm Synergy DES,  reducing percent stenosis to 0 and TIMI-3 flow post PCI 3.  Widely patent left main with no stenosis 4.  Patent LAD with moderate LAD/first diagonal bifurcation disease appropriate for medical therapy 5.  Patent circumflex and intermediate branches with no significant stenoses 6.  Severe hypokinesis of the inferior wall with moderately reduced LVEF of 45%, moderately elevated LVEDP 28 mmHg Recommendations: DAPT with aspirin  and Effient  x 12 months without interruption.  Effient  60 mg loaded orally on the table.  2D echocardiogram for reassessment of LVEF and presence of valvular disease.  Post MI medical therapy.   DG Chest 2 View Result Date: 07/28/2024 CLINICAL DATA:  Chest pain EXAM: CHEST - 2 VIEW COMPARISON:  CT chest 08/30/2022 FINDINGS: Emphysematous changes in the lungs. Lungs appear clear and expanded. No focal consolidation or airspace disease. Heart size and pulmonary vascularity are normal. Mediastinal contours appear intact. Postoperative changes in the cervical spine. No pleural effusion or pneumothorax. IMPRESSION: Emphysematous changes in the lungs. No evidence of active pulmonary disease. Electronically Signed   By: Elsie Gravely M.D.   On: 07/28/2024 21:01    Disposition Pt is being discharged home today in good condition.  Follow-up Plans & Appointments  Discharge Instructions     (  HEART FAILURE PATIENTS) Call MD:  Anytime you have any of the following symptoms: 1) 3 pound weight gain in 24 hours or 5 pounds in 1 week 2) shortness of breath, with or without a dry hacking cough 3) swelling in the hands, feet or stomach 4) if you have to sleep on extra pillows at night in order to breathe.   Complete by: As directed    AMB Referral to Advanced Lipid Disorders Clinic   Complete by: As directed    Internal Lipid Clinic Referral Scheduling  Internal lipid clinic referrals are providers within [facility], who wish to refer established patients for routine management (help in starting  PCSK9 inhibitor therapy) or advanced therapies.  Internal MD referral criteria:              1. All patients with LDL>190 mg/dL  2. All patients with Triglycerides >500 mg/dL  3. Patients with suspected or confirmed heterozygous familial hyperlipidemia (HeFH) or homozygous familial hyperlipidemia (HoFH)  4. Patients with family history of suspicious for genetic dyslipidemia desiring genetic testing  5. Patients refractory to standard guideline based therapy  6. Patients with statin intolerance (failed 2 statins, one of which must be a high potency statin)  7. Patients who the provider desires to be seen by MD   Internal PharmD referral criteria:   1. Follow-up patients for medication management  2. Follow-up for compliance monitoring  3. Patients for drug education  4. Patients with statin intolerance  5. PCSK9 inhibitor education and prior authorization approvals  6. Patients with triglycerides <500 mg/dL  External Lipid Clinic Referral  External lipid clinic referrals are for providers outside of [facility], considered new clinic patients - automatically routed to MD schedule   Amb Referral to Cardiac Rehabilitation   Complete by: As directed    Diagnosis:  STEMI PTCA Coronary Stents     After initial evaluation and assessments completed: Virtual Based Care may be provided alone or in conjunction with Phase 2 Cardiac Rehab based on patient barriers.: Yes   Intensive Cardiac Rehabilitation (ICR) [facility] location only OR Traditional Cardiac Rehabilitation (TCR) *If criteria for ICR are not met will enroll in TCR [facility] only): Yes   Diet - low sodium heart healthy   Complete by: As directed    Diet - low sodium heart healthy   Complete by: As directed    Increase activity slowly   Complete by: As directed    Increase activity slowly   Complete by: As directed        Discharge Medications Allergies as of 07/30/2024   No Known Allergies      Medication List     TAKE  these medications    aspirin  EC 81 MG tablet Take 1 tablet (81 mg total) by mouth daily. Swallow whole.   atorvastatin  80 MG tablet Commonly known as: LIPITOR Take 1 tablet (80 mg total) by mouth daily.   Entresto  24-26 MG Generic drug: sacubitril -valsartan  Take 1 tablet by mouth 2 (two) times daily.   Jardiance  10 MG Tabs tablet Generic drug: empagliflozin  Take 1 tablet (10 mg total) by mouth daily.   metoprolol  succinate 25 MG 24 hr tablet Commonly known as: TOPROL -XL Take 1 tablet (25 mg total) by mouth daily.   nitroGLYCERIN  0.4 MG SL tablet Commonly known as: NITROSTAT  Place 1 tablet (0.4 mg total) under the tongue every 5 (five) minutes x 3 doses as needed for chest pain.   omeprazole 20 MG tablet Commonly known as: PRILOSEC  OTC Take 20 mg by mouth daily.   prasugrel  10 MG Tabs tablet Commonly known as: EFFIENT  Take 1 tablet (10 mg total) by mouth daily.   spironolactone  25 MG tablet Commonly known as: ALDACTONE  Take 0.5 tablets (12.5 mg total) by mouth daily.   TYLENOL  PO Take 2-3 tablets by mouth 2 (two) times daily as needed (headache, pain).         Outstanding Labs/Studies Recommend repeat echocardiogram in 10/2024.  Duration of Discharge Encounter: MD Time: 30 minutes   Signed, Newman JINNY Lawrence, MD 07/30/2024, 2:53 PM

## 2024-07-30 NOTE — Progress Notes (Addendum)
   07/30/24 1208  TOC Brief Assessment  Insurance and Status Reviewed  Patient has primary care physician Yes  Home environment has been reviewed home  Prior level of function: independent  Prior/Current Home Services No current home services  Social Drivers of Health Review SDOH reviewed no interventions necessary  Readmission risk has been reviewed Yes ((Green-9%))  Transition of care needs no transition of care needs at this time    Pt stable for transition home today, no HH or DME needs noted. PharmD placed benefits check for new meds Entrestro- copay $75, Jardiance  and Effient  both $100 copay.  TOC pharmacy to fill meds prior to discharge. Pt will follow up as per AVS instructions.

## 2024-07-30 NOTE — Telephone Encounter (Signed)
 Called patient unable to leave a messsage the mailbox was full. Some will need to ry again on 07/31/24

## 2024-07-30 NOTE — Telephone Encounter (Signed)
 Patient Product/process development scientist completed.    The patient is insured through [facility]. Patient has [employer], may use a copay card, and/or apply for patient assistance if available.    Ran test claim for sacubitril -valsartan  (Entresto ) 24-26 mg and the current 30 day co-pay is $75.00.   This test claim was processed through [facility]- copay amounts may vary at other pharmacies due to pharmacy/plan contracts, or as the patient moves through the different stages of their insurance plan.     Reyes Sharps, CPHT Pharmacy Technician III Certified Patient Advocate [facility] Patient Advocate Team Direct Number: [phone]  Fax: [phone]

## 2024-07-30 NOTE — Progress Notes (Signed)
 Heart Failure Navigator Progress Note  Assessed for Heart & Vascular TOC clinic readiness.  Patient does not meet criteria due to patient has a scheduled [facility] appointment already for 08/06/2024 . [name]   Navigator available for reassessment of patient.   Stephane Haddock, BSN, Scientist, clinical (histocompatibility and immunogenetics) Only

## 2024-07-30 NOTE — Progress Notes (Addendum)
 CARDIAC REHAB PHASE I   PRE:  Rate/Rhythm: 97 SR  BP:  Sitting: 133/92      SaO2: 95 RA  MODE:  Ambulation: 940 ft   POST:  Rate/Rhythm: 92 SR  BP:  Sitting: 118/79 later        SaO2: 95 RA   Post MI/stent education including restrictions, risk factors, exercise guidelines, antiplatelet therapy importance, MI booklet, NTG use, heart healthy diet, smoking cessation and CRP2 reviewed. Smoking cessation resources given and pt does want to quit smoking.  Post stent education including site care, restrictions, risk factors, exercise guidelines reviewed.  Heart Failure education reviewed including daily weights and maintaining weight logs.  All questions and concerns addressed. Will refer to [facility] for CRP2.    Fairy JONETTA Music, RN BSN 07/30/2024 11:46 AM [phone]

## 2024-07-30 NOTE — Progress Notes (Signed)
 Reviewed AVS with patient  Has to receive another medication and floor will continue discharge.

## 2024-07-30 NOTE — Telephone Encounter (Signed)
   Transition of Care Follow-up Phone Call Request    Patient Name: Aaron Avila Date of Birth: 07-Mar-1971 Date of Encounter: 07/30/2024  Primary Care Provider:  Glover Lenis, MD Primary Cardiologist:  Newman JINNY Lawrence, MD  Aaron Avila has been scheduled for a transition of care follow up appointment with a [facility] provider:  Josefa Beauvais 10/1  Please reach out to Aaron Avila within 48 hours of discharge to confirm appointment and review transition of care protocol questionnaire. Anticipated discharge date: 9/24  Manuelita Rummer, NP  07/30/2024, 10:24 AM

## 2024-07-31 NOTE — Telephone Encounter (Signed)
 Patient contacted regarding discharge from [facility] on 07/30/24.  Patient understands to follow up with provider Josefa Beauvais, NP on 08/06/24 at 2:45 PM at [address] [place], [place] [address]. Patient understands discharge instructions? Yes Patient understands medications and regiment? Yes Patient understands to bring all medications to this visit? Yes  Ask patient:  Are you enrolled in My Chart? Yes but forgot log in. Explained there is a MyChart help number on DC paperwork.    Postop Surgical Patients:                What is your wound status? Any signs/ symptoms of infection (Temp, redness/ red streaks, swelling, purulent drainage, foul odor or smell)? No signs of infection, states looks the same as it did at DC.              Please do not place any creams/ lotions/ or antibiotic ointment on any surgical incisions/ wounds without physician approval.              Do you have any questions about your medications?  All medications (except pain medications) are to be filled by your Cardiologist AFTER your first post op appointment with them.  Are you taking your pain medication? No questions about medications               How is your pain controlled? Pain level? No pain              If you require a refill on pain medications, know that the same medication/ amount may not be prescribed or a refill may not be given.  Please contact your pharmacy for refill requests.               Do you have help at home with ADL's?  If you have home health, have you been contacted or seen by the agency? States he is able to do ADLs on his own.              Please refer to your Pre/post surgery booklet, there is a lot of useful information in it that may answer any questions you may have.              Please note that it is ok to remove your surgical dressing, shower (soap/ water ), and pat the incision dry.  For surgery related questions staff will route a phone note to CV DIV TCTS [facility] pool  [facility] [address] [phone] [place], [place] [address]   For patients Pt stated he went over DC paperwork when he got home yesterday but is going to look at it closer today. He said he would call if he had any questions but he denies any questions at this time.

## 2024-08-05 NOTE — Progress Notes (Unsigned)
 Cardiology Office Note:  .   Date:  08/05/2024  ID:  Aaron Avila, DOB 1971/06/05, MRN [id number] PCP: Aaron Lenis, MD  [facility] Providers Cardiologist:  Aaron JINNY Lawrence, MD {  History of Present Illness: .   Aaron Avila is a 53 y.o. male with PMHx of CAD s/p DES x2  to prox-mid RCA in 07/2024, NSVT in the setting of ischemic cardiomyopathy/HFrEF (ECHO 07/29/2024: Ef 35-40%), HTN, nicotine dependence who reports to [facility] office for follow up.   Recent hospitalization 9/22-24/2025 admitted for acute inferior STEMI secondary to total occlusion of the distal RCA s/p DES proximal and distal RCA with cath also showing widely patent LM, moderate LAD/first diagonal bifurcation, patent circumflex and intermediate branches with no significant stenosis, severe hypokinesis of inferior wall with LVEF 45%, and moderately elevated LVEDP 28 mmHg.  Recommended DAPT with ASA and Effient  for 12 months.  Noted most likely familiar hypercholesterolemia with LDL of 221 and father with fatal MI at age 93.  Ordered Lipitor 80 mg daily and referred to lipid clinic for injectable agent.  Post echo 9/23 showed EF 35 to 40%, severe inferior hypokinesis, normal RV function, dilated IVC, no significant valvular abnormalities.  Started on Entresto  24/26 mg twice daily.  Continued on Toprol  XL 25 mg daily, Jardiance  10 mg daily, spironolactone  12.5 mg daily.  Plans to repeat echo in 3 months.  Today, reports ### and denies ###.  Reports compliance with medications.  Dietary habitats:  Activity level:  Social: Denies tobacco use/alcohol/drug use  Denies any recent hospitalizations or visits to the emergency department.   Coronary artery disease involving native coronary artery of native heart without angina pectoris Hypercholesterolemia with LDL greater than 190 mg/dL Hyperlipidemia LDL goal <70 Familial hypercholesterolemia 9/22-24/2025 admitted for acute inferior STEMI secondary to total occlusion  of the distal RCA s/p DES proximal and distal RCA with cath also showing widely patent LM, moderate LAD/first diagonal bifurcation, patent circumflex and intermediate branches with no significant stenosis, severe hypokinesis of inferior wall with LVEF 45%, and moderately elevated LVEDP 28 mmHg.  Recommended DAPT with ASA and Effient  for 12 months.    Previously noted most likely familiar hypercholesterolemia with LDL of 221 in 07/2024 and father with fatal MI at age 32.  Already referred to Lipid Clinic.  Continue on ASA 81 mg daily, Lipitor 80 mg, Effient  10 mg daily Order FLP and CMP in 11/2023   HFrEF (heart failure with reduced ejection fraction) (HCC) Ischemic cardiomyopathy ECHO 07/29/2024 showed EF 35 to 40%, severe inferior hypokinesis, normal RV function, dilated IVC, no significant valvular abnormalities.  Denies SOB, edema, orthopnea, or PND. Appears Euvolemic on exam.  Continue on Entresto  24/26 mg twice daily, Toprol  XL 25 mg daily, Jardiance  10 mg daily, spironolactone  12.5 mg daily.  Encouraged low sodium diet, fluid restriction <2L, and daily weights.  Educated to contact our office for weight gain of 2 lbs overnight or 5 lbs in one week. ED precautions discussed.   Order repeat ECHO in 11/2023.   NSVT  in the setting of ischemic cardiomyopathy/HFrEF in 07/2024 Optimize medical management of ischemic cardiomyopathy. No indication for LifeVest at this time.   Primary hypertension Reports well controlled Home BP:  BP this OV well controlled today:  Managed by GDMT as above.  Encourage physical activity for 150 minutes per week and heart healthy low sodium diet. Discussed limiting sodium intake to < 2 grams daily.     Tobacco use Currently smokes #  PPD  Dicussed cessation but no desire for cessation    ROS: 10 point review of system has been reviewed and considered negative except ones been listed in the HPI.   Studies Reviewed: [name]   Cath 07/28/2024   LV end diastolic pressure  is moderately elevated.   The left ventricular ejection fraction is 45-50% by visual estimate.   1.  Acute inferior STEMI secondary to total occlusion of the distal RCA.  Successful PCI of the distal vessel culprit lesion using a 3.0 x 24 mm Synergy DES, treated 100% stenosis and TIMI 0 flow, reduced to 0% stenosis and TIMI-3 flow. 2.  Severe proximal RCA stenosis of 75% treated with a 3.5 x 24 mm Synergy DES, reducing percent stenosis to 0 and TIMI-3 flow post PCI 3.  Widely patent left main with no stenosis 4.  Patent LAD with moderate LAD/first diagonal bifurcation disease appropriate for medical therapy 5.  Patent circumflex and intermediate branches with no significant stenoses 6.  Severe hypokinesis of the inferior wall with moderately reduced LVEF of 45%, moderately elevated LVEDP 28 mmHg   Recommendations: DAPT with aspirin  and Effient  x 12 months without interruption.  Effient  60 mg loaded orally on the table.  2D echocardiogram for reassessment of LVEF and presence of valvular disease.  Post MI medical therapy.  ECHO 07/29/2024 Sonographer Comments: Technically difficult study due to poor echo  windows. Image acquisition challenging due to patient body habitus and  Image acquisition challenging due to respiratory motion.  IMPRESSIONS   1. Left ventricular ejection fraction, by estimation, is 35 to 40%. The  left ventricle has moderately decreased function. The left ventricle  demonstrates regional wall motion abnormalities (see scoring  diagram/findings for description). Left ventricular   diastolic parameters were normal. LAD and RCA territory wall motion  abnormalities; not well visualized in all views basal function is  preserved.   2. Right ventricular systolic function is normal. The right ventricular  size is normal. Tricuspid regurgitation signal is inadequate for assessing  PA pressure.   3. The mitral valve was not well visualized. No evidence of mitral valve   regurgitation. No evidence of mitral stenosis.   4. The aortic valve was not well visualized. Aortic valve regurgitation  is not visualized. No aortic stenosis is present.   5. The inferior vena cava is dilated in size with >50% respiratory  variability, suggesting right atrial pressure of 8 mmHg.   Comparison(s): No prior Echocardiogram.   Risk Assessment/Calculations:   {Does this patient have ATRIAL FIBRILLATION?[phone]} No BP recorded.  {Refresh Note OR Click here to enter BP  :1}***       Physical Exam:   VS:  There were no vitals taken for this visit.   Wt Readings from Last 3 Encounters:  07/28/24 251 lb 8.7 oz (114.1 kg)  03/09/23 236 lb 7.8 oz (107.3 kg)  09/24/16 240 lb (108.9 kg)    GEN: Well nourished, well developed in no acute distress while sitting in chair.  NECK: No JVD; No carotid bruits CARDIAC: ***RRR, no murmurs, rubs, gallops RESPIRATORY:  Clear to auscultation without rales, wheezing or rhonchi  ABDOMEN: Soft, non-tender, non-distended EXTREMITIES:  No edema; No deformity   ASSESSMENT AND PLAN: .   *** {The patient has an active order for outpatient cardiac rehabilitation.   Please indicate if the patient is ready to start. Do NOT delete this.  It will auto delete.  Refresh note, then sign.  Click here to document readiness and see contraindications.  :1}  Cardiac Rehabilitation Eligibility Assessment      {Are you ordering a CV Procedure (e.g. stress test, cath, DCCV, TEE, etc)?   Press F2        :[id number]}  Dispo: ***  Signed, Lorette CINDERELLA Kapur, PA-C

## 2024-08-06 ENCOUNTER — Ambulatory Visit: Attending: Internal Medicine | Admitting: Physician Assistant

## 2024-08-06 ENCOUNTER — Encounter: Payer: Self-pay | Admitting: General Practice

## 2024-08-06 VITALS — BP 126/72 | HR 94 | Ht 72.0 in | Wt 249.2 lb

## 2024-08-06 DIAGNOSIS — I4729 Other ventricular tachycardia: Secondary | ICD-10-CM

## 2024-08-06 DIAGNOSIS — I255 Ischemic cardiomyopathy: Secondary | ICD-10-CM

## 2024-08-06 DIAGNOSIS — E785 Hyperlipidemia, unspecified: Secondary | ICD-10-CM | POA: Diagnosis not present

## 2024-08-06 DIAGNOSIS — I2510 Atherosclerotic heart disease of native coronary artery without angina pectoris: Secondary | ICD-10-CM | POA: Diagnosis not present

## 2024-08-06 DIAGNOSIS — E78019 Familial hypercholesterolemia, unspecified: Secondary | ICD-10-CM | POA: Diagnosis not present

## 2024-08-06 DIAGNOSIS — I10 Essential (primary) hypertension: Secondary | ICD-10-CM

## 2024-08-06 DIAGNOSIS — Z720 Tobacco use: Secondary | ICD-10-CM

## 2024-08-06 DIAGNOSIS — I5020 Unspecified systolic (congestive) heart failure: Secondary | ICD-10-CM

## 2024-08-06 DIAGNOSIS — E7800 Pure hypercholesterolemia, unspecified: Secondary | ICD-10-CM

## 2024-08-06 MED ORDER — SPIRONOLACTONE 25 MG PO TABS: 25.0000 mg | ORAL_TABLET | Freq: Every day | ORAL | 3 refills | Status: AC

## 2024-08-06 NOTE — Patient Instructions (Signed)
 Medication Instructions:  Increase Spironolactone  25 mg take 1 tablet daily *If you need a refill on your cardiac medications before your next appointment, please call your pharmacy*  Lab Work: 1 week- BMP If you have labs (blood work) drawn today and your tests are completely normal, you will receive your results only by: MyChart Message (if you have MyChart) OR A paper copy in the mail If you have any lab test that is abnormal or we need to change your treatment, we will call you to review the results.  Follow-Up: At [facility], you and your health needs are our priority.  As part of our continuing mission to provide you with exceptional heart care, our providers are all part of one team.  This team includes your primary Cardiologist (physician) and Advanced Practice Providers or APPs (Physician Assistants and Nurse Practitioners) who all work together to provide you with the care you need, when you need it.  Your next appointment:   1 month(s)  Provider:   One of our Advanced Practice Providers (APPs): Morse Clause, PA-C  Lamarr Satterfield, NP Miriam Shams, NP  Olivia Pavy, PA-C Josefa Beauvais, NP  Leontine Salen, PA-C Orren Fabry, PA-C  Hao Meng, PA-C Ernest Dick, NP  Damien Braver, NP Jon Hails, PA-C  Waddell Donath, PA-C    Dayna Dunn, PA-C  Scott Weaver, PA-C Lum Louis, NP Katlyn West, NP Callie Goodrich, PA-C  Xika Zhao, NP Sheng Haley, PA-C    Kathleen Johnson, PA-C       We recommend signing up for the patient portal called MyChart.  Sign up information is provided on this After Visit Summary.  MyChart is used to connect with patients for Virtual Visits (Telemedicine).  Patients are able to view lab/test results, encounter notes, upcoming appointments, etc.  Non-urgent messages can be sent to your provider as well.   To learn more about what you can do with MyChart, go to [email or web address].   Other Instructions Please purchase an OMRON blood  pressure cuff

## 2024-08-07 ENCOUNTER — Encounter: Payer: Self-pay | Admitting: Physician Assistant

## 2024-08-07 ENCOUNTER — Telehealth: Payer: Self-pay

## 2024-08-07 DIAGNOSIS — E7849 Other hyperlipidemia: Secondary | ICD-10-CM

## 2024-08-07 NOTE — Telephone Encounter (Signed)
 Order placed and patient agrees with plan of care.

## 2024-08-07 NOTE — Telephone Encounter (Signed)
 Thank you :)

## 2024-08-07 NOTE — Telephone Encounter (Signed)
-----   Message from [facility] sent at 08/07/2024 11:13 AM EDT ----- Patient likely has familial hyperlipidemia and had recent STEMI.  We should not wait for 3 months to repeat lipid panel.  Let us  check lipid panel in 2 weeks and refer to lipid clinic for injectable agents.  Thanks MJP ----- Message ----- From: Sheron Lorette GRADE, PA-C Sent: 08/07/2024   8:09 AM EDT To: Newman JINNY Lawrence, MD

## 2024-08-08 ENCOUNTER — Ambulatory Visit: Attending: Cardiology | Admitting: Pharmacist

## 2024-08-08 ENCOUNTER — Encounter: Payer: Self-pay | Admitting: Pharmacist

## 2024-08-08 DIAGNOSIS — E78019 Familial hypercholesterolemia, unspecified: Secondary | ICD-10-CM | POA: Diagnosis not present

## 2024-08-08 NOTE — Assessment & Plan Note (Addendum)
 Assessment:  LDL goal: < 55 mg/dl; last LDLc  221mg /dl (0/7974) Patient has been taking atorvastatin  80 mg ~ 1 week and reports tolerating well Discussed next potential options to assist in achieving LDL goal <55 (PCSK-9 inhibitors); cost, dosing efficacy, side effects  Discussed and emphasized importance of healthy lifestyle (diet/exercise/smoking cessation); Provided healthy dietary handout Patient has decreased amount of cigarettes per day and is interested in quitting on his own Pt has not been contacted yet for rehab but is engaging in minimal activity (daily walking) until then  Plan: Continue taking atorvastatin  80 mg daily Return for lipid panel and BMP on 08/20/24 Will apply for PA for PCSK9i once lab results Will inform patient upon approval

## 2024-08-08 NOTE — Progress Notes (Signed)
 Patient ID: Aaron Avila                 DOB: 1971/08/17                    MRN: [id number]      HPI: Aaron Avila is a 53 y.o. male patient referred to lipid clinic by Dr. Elmira. PMH is significant for CAD s/p DES x2 to prox-mid RCA in 07/2024, HFrEF, HTN, and nicotine dependence.   Patient was recently seen by Lorette Kapur, PA on 08/06/24 after recent hospitalization of acute inferior STEMI. Most recent lipid panel revealed LDL of 221, likely familial hypercholesterolemia. Patient was started on atorvastatin  80 mg daily ~ 1 week ago. He states that he is tolerating the medication well other than experiencing a mild headache that subsided quickly. He also reports that his spironolactone  was increased to 25 mg daily at his last visit and he has labs to obtain soon.   Patient presents today in good spirits. Patient acknowledges the seriousness of his heart attack and reports making several lifestyle changes since experiencing a heart attack last week.   Prior to MI, the patient was not active and diet included sodas and less healthy food choices. He has since eliminated sodas from his diet and is now making healthier food choices. He is selecting meals low in sodium, using low fat dressings and incorporating more salads and drinking water  regularly. He has an air fryer that he uses regularly to prepare meals.  He expresses interest in exploring additional cholesterol lowering medications and is aware that new therapy would serve as an add on to current therapy.   Reviewed options for lowering LDL cholesterol, including PCSK-9 inhibitors. Discussed mechanisms of action, dosing, side effects and potential decreases in LDL cholesterol.  Also reviewed cost information.   Current Medications: atorvastatin  80 mg daily  Intolerances: none  Risk Factors: STEMI, CAD s/p DES x2, HFrEF, HTN LDL goal: <55  Lipid panel: (07/2024): Chol 305, Trig 179, HDL 48, LDL 221  Diet: Low sodium, low fat dietary  options: Salads with low fat dressing Low sodium tuna Drinks: water , eliminated sodas  Exercise:  Per most recent provider note, pt is cleared for cardiac rehab. Patient was told about this referral in the hospital but has not heard from rehab yet.  He is engaging in minimal exercise as he recovers (walking to driveway).   Family History:   Father - fatal MI at age 31 Grandfather - fatal MI (11) Sister - passed away (alcoholism)  Social History:  Alcohol: occasionally, 2-3 alcoholic beverages since New Years Day Smoking: Patient has reduced smoking from 20-25 to 5-6 cigarettes per day. He is not interested in smoking cessation agents/resources at this time; He is interested in quitting on his own.  Labs:  Lipid Panel     Component Value Date/Time   CHOL 305 (H) 07/28/2024 2039   TRIG 179 (H) 07/28/2024 2039   HDL 48 07/28/2024 2039   CHOLHDL 6.4 07/28/2024 2039   VLDL 36 07/28/2024 2039   LDLCALC 221 (H) 07/28/2024 2039    No past medical history on file.  Current Outpatient Medications on File Prior to Visit  Medication Sig Dispense Refill   Acetaminophen  (TYLENOL  PO) Take 2-3 tablets by mouth 2 (two) times daily as needed (headache, pain).     aspirin  EC 81 MG tablet Take 1 tablet (81 mg total) by mouth daily. Swallow whole. 90 tablet 3   atorvastatin  (  LIPITOR) 80 MG tablet Take 1 tablet (80 mg total) by mouth daily. 90 tablet 3   empagliflozin  (JARDIANCE ) 10 MG TABS tablet Take 1 tablet (10 mg total) by mouth daily. 90 tablet 3   metoprolol  succinate (TOPROL -XL) 25 MG 24 hr tablet Take 1 tablet (25 mg total) by mouth daily. 90 tablet 3   nitroGLYCERIN  (NITROSTAT ) 0.4 MG SL tablet Place 1 tablet (0.4 mg total) under the tongue every 5 (five) minutes x 3 doses as needed for chest pain. 25 tablet 3   omeprazole (PRILOSEC OTC) 20 MG tablet Take 20 mg by mouth daily.     prasugrel  (EFFIENT ) 10 MG TABS tablet Take 1 tablet (10 mg total) by mouth daily. 90 tablet 3    sacubitril -valsartan  (ENTRESTO ) 24-26 MG Take 1 tablet by mouth 2 (two) times daily. 60 tablet 11   spironolactone  (ALDACTONE ) 25 MG tablet Take 1 tablet (25 mg total) by mouth daily. 90 tablet 3   No current facility-administered medications on file prior to visit.    No Known Allergies  Assessment/Plan:  1. Hyperlipidemia -  Problem  Familial Hypercholesterolemia   Familial hypercholesterolemia Assessment:  LDL goal: < 55 mg/dl; last LDLc  221mg /dl (0/7974) Patient has been taking atorvastatin  80 mg ~ 1 week and reports tolerating well Discussed next potential options to assist in achieving LDL goal <55 (PCSK-9 inhibitors); cost, dosing efficacy, side effects  Discussed and emphasized importance of healthy lifestyle (diet/exercise/smoking cessation); Provided healthy dietary handout Patient has decreased amount of cigarettes per day and is interested in quitting on his own Pt has not been contacted yet for rehab but is engaging in minimal activity (daily walking) until then  Plan: Continue taking atorvastatin  80 mg daily Return for lipid panel and BMP on 08/20/24 Will apply for PA for PCSK9i once lab results Will inform patient upon approval     Thank you,  Lakedra E. White, Pharm.D Sheridan Elspeth BIRCH. [facility] [address], [place], [place] [address] Phone: [phone]; Fax: [phone]    Alenna Russell D Geraldine, Pharm.BIRCH SARAN, CPP [facility] [address] [place], [place] [address]  Phone: [phone]; Fax: [phone]

## 2024-08-08 NOTE — Patient Instructions (Signed)
 Your Results:             Your most recent labs Goal  Total Cholesterol 305 < 200  Triglycerides 179 < 150  HDL (happy/good cholesterol) 48 > 40  LDL (lousy/bad cholesterol 221 < 55   Medication changes: - Continue atorvastain 80 mg daily   We will start the process to get Repatha covered by your insurance once your labs are finalized.  Once the prior authorization is complete, we will call you to let you know and confirm pharmacy information.     Repatha is a cholesterol medication that improved your body's ability to get rid of bad cholesterol known as LDL. It can lower your LDL up to 60%! It is an injection that is given under the skin every 2 weeks. The medication often requires a prior authorization from your insurance company. We will take care of submitting all the necessary information to your insurance company to get it approved. The most common side effects of Repatha include runny nose, symptoms of the common cold, rarely flu or flu-like symptoms, back/muscle pain in about 3-4% of the patients, and redness, pain, or bruising at the injection site.   Lab orders: We want you to repeat lipid panel and basic metabolic panel on 08/20/2024. Please make sure you are fasting before these labs     [facility] locations: [facility] - [address] First floor - [address] ([facility]) [phone]) - [phone]) - [phone] [address]. [address] [phone])   [facility]er, manufacturing systems office - [address] [facility] - [address] [phone])   [place]  - [address] [phone])   [place] - [address] [phone]) - [address] [phone])   [place]  - [address] [phone]) - [phone] [address] ([facility]) [phone])

## 2024-08-13 ENCOUNTER — Emergency Department

## 2024-08-13 ENCOUNTER — Other Ambulatory Visit: Payer: Self-pay

## 2024-08-13 ENCOUNTER — Observation Stay
Admission: EM | Admit: 2024-08-13 | Discharge: 2024-08-14 | Disposition: A | Discharge status: Home / Self Care | Attending: Student | Admitting: Student

## 2024-08-13 ENCOUNTER — Observation Stay (HOSPITAL_BASED_OUTPATIENT_CLINIC_OR_DEPARTMENT_OTHER)
Admit: 2024-08-13 | Discharge: 2024-08-13 | Disposition: A | Discharge status: Home / Self Care | Attending: Cardiology | Admitting: Cardiology

## 2024-08-13 DIAGNOSIS — E78019 Familial hypercholesterolemia, unspecified: Secondary | ICD-10-CM

## 2024-08-13 DIAGNOSIS — R0789 Other chest pain: Secondary | ICD-10-CM | POA: Diagnosis present

## 2024-08-13 DIAGNOSIS — I472 Ventricular tachycardia, unspecified: Secondary | ICD-10-CM | POA: Insufficient documentation

## 2024-08-13 DIAGNOSIS — I428 Other cardiomyopathies: Secondary | ICD-10-CM | POA: Diagnosis not present

## 2024-08-13 DIAGNOSIS — K219 Gastro-esophageal reflux disease without esophagitis: Secondary | ICD-10-CM | POA: Insufficient documentation

## 2024-08-13 DIAGNOSIS — I110 Hypertensive heart disease with heart failure: Secondary | ICD-10-CM | POA: Insufficient documentation

## 2024-08-13 DIAGNOSIS — F12920 Cannabis use, unspecified with intoxication, uncomplicated: Secondary | ICD-10-CM | POA: Diagnosis not present

## 2024-08-13 DIAGNOSIS — R079 Chest pain, unspecified: Secondary | ICD-10-CM

## 2024-08-13 DIAGNOSIS — Z8679 Personal history of other diseases of the circulatory system: Secondary | ICD-10-CM | POA: Insufficient documentation

## 2024-08-13 DIAGNOSIS — F17200 Nicotine dependence, unspecified, uncomplicated: Secondary | ICD-10-CM | POA: Diagnosis present

## 2024-08-13 DIAGNOSIS — I959 Hypotension, unspecified: Secondary | ICD-10-CM

## 2024-08-13 DIAGNOSIS — Z7982 Long term (current) use of aspirin: Secondary | ICD-10-CM | POA: Diagnosis not present

## 2024-08-13 DIAGNOSIS — Z79899 Other long term (current) drug therapy: Secondary | ICD-10-CM | POA: Diagnosis not present

## 2024-08-13 DIAGNOSIS — F17220 Nicotine dependence, chewing tobacco, uncomplicated: Secondary | ICD-10-CM | POA: Diagnosis not present

## 2024-08-13 DIAGNOSIS — I200 Unstable angina: Secondary | ICD-10-CM | POA: Diagnosis not present

## 2024-08-13 DIAGNOSIS — I252 Old myocardial infarction: Secondary | ICD-10-CM

## 2024-08-13 DIAGNOSIS — E7800 Pure hypercholesterolemia, unspecified: Secondary | ICD-10-CM | POA: Diagnosis not present

## 2024-08-13 DIAGNOSIS — I25118 Atherosclerotic heart disease of native coronary artery with other forms of angina pectoris: Secondary | ICD-10-CM | POA: Diagnosis not present

## 2024-08-13 DIAGNOSIS — Z959 Presence of cardiac and vascular implant and graft, unspecified: Secondary | ICD-10-CM | POA: Diagnosis not present

## 2024-08-13 DIAGNOSIS — I5022 Chronic systolic (congestive) heart failure: Secondary | ICD-10-CM | POA: Diagnosis not present

## 2024-08-13 DIAGNOSIS — F17210 Nicotine dependence, cigarettes, uncomplicated: Secondary | ICD-10-CM | POA: Diagnosis not present

## 2024-08-13 DIAGNOSIS — I213 ST elevation (STEMI) myocardial infarction of unspecified site: Secondary | ICD-10-CM | POA: Diagnosis not present

## 2024-08-13 DIAGNOSIS — I429 Cardiomyopathy, unspecified: Secondary | ICD-10-CM

## 2024-08-13 HISTORY — DX: Acute myocardial infarction, unspecified: I21.9

## 2024-08-13 LAB — RESP PANEL BY RT-PCR (RSV, FLU A&B, COVID)  RVPGX2
Influenza A by PCR: NEGATIVE
Influenza B by PCR: NEGATIVE
Resp Syncytial Virus by PCR: NEGATIVE
SARS Coronavirus 2 by RT PCR: NEGATIVE

## 2024-08-13 LAB — ECHOCARDIOGRAM LIMITED
Height: 72 in
Single Plane A4C EF: 60.5 %
Weight: 3920 [oz_av]

## 2024-08-13 LAB — TROPONIN I (HIGH SENSITIVITY)
Troponin I (High Sensitivity): 9 ng/L (ref <18)
Troponin I (High Sensitivity): 10 ng/L (ref <18)

## 2024-08-13 LAB — CBC
HCT: 41.9 % (ref 39.0–52.0)
Hemoglobin: 13.9 g/dL (ref 13.0–17.0)
MCH: 30.5 pg (ref 26.0–34.0)
MCHC: 33.2 g/dL (ref 30.0–36.0)
MCV: 92.1 fL (ref 80.0–100.0)
Platelets: 324 K/uL (ref 150–400)
RBC: 4.55 MIL/uL (ref 4.22–5.81)
RDW: 12.4 % (ref 11.5–15.5)
WBC: 9.3 K/uL (ref 4.0–10.5)
nRBC: 0 % (ref 0.0–0.2)

## 2024-08-13 LAB — BASIC METABOLIC PANEL WITH GFR
Anion gap: 9 (ref 5–15)
BUN: 21 mg/dL — ABNORMAL HIGH (ref 6–20)
CO2: 23 mmol/L (ref 22–32)
Calcium: 9.1 mg/dL (ref 8.9–10.3)
Chloride: 105 mmol/L (ref 98–111)
Creatinine, Ser: 0.82 mg/dL (ref 0.61–1.24)
GFR, Estimated: >60 mL/min (ref >60)
Glucose, Bld: 102 mg/dL — ABNORMAL HIGH (ref 70–99)
Potassium: 4.7 mmol/L (ref 3.5–5.1)
Sodium: 137 mmol/L (ref 135–145)

## 2024-08-13 MED ORDER — COLCHICINE 0.6 MG PO TABS
0.6000 mg | ORAL_TABLET | Freq: Two times a day (BID) | ORAL | Status: DC
  Administered 2024-08-13 – 2024-08-14 (×3): 0.6 mg via ORAL
  Filled 2024-08-13 (×3): qty 1

## 2024-08-13 MED ORDER — HEPARIN BOLUS VIA INFUSION
4000.0000 [IU] | Freq: Once | INTRAVENOUS | Status: DC
  Filled 2024-08-13: qty 4000

## 2024-08-13 MED ORDER — METOPROLOL SUCCINATE ER 50 MG PO TB24: 25.0000 mg | ORAL_TABLET | Freq: Every day | ORAL | Status: DC

## 2024-08-13 MED ORDER — SPIRONOLACTONE 25 MG PO TABS
25.0000 mg | ORAL_TABLET | Freq: Every day | ORAL | Status: DC
Start: 2024-08-14 — End: 2024-08-14
  Administered 2024-08-14: 25 mg via ORAL
  Filled 2024-08-13: qty 1

## 2024-08-13 MED ORDER — NITROGLYCERIN 2 % TD OINT
0.5000 [in_us] | TOPICAL_OINTMENT | TRANSDERMAL | Status: AC
  Administered 2024-08-13: 0.5 [in_us] via TOPICAL
  Filled 2024-08-13: qty 1

## 2024-08-13 MED ORDER — NITROGLYCERIN 0.4 MG SL SUBL
0.4000 mg | SUBLINGUAL_TABLET | SUBLINGUAL | Status: DC | PRN
  Administered 2024-08-13: 0.4 mg via SUBLINGUAL
  Filled 2024-08-13: qty 1

## 2024-08-13 MED ORDER — NITROGLYCERIN 0.4 MG SL SUBL: 0.4000 mg | SUBLINGUAL_TABLET | SUBLINGUAL | Status: DC | PRN

## 2024-08-13 MED ORDER — ACETAMINOPHEN 325 MG PO TABS: 650.0000 mg | ORAL_TABLET | ORAL | Status: DC | PRN

## 2024-08-13 MED ORDER — ASPIRIN 81 MG PO TBEC: 81.0000 mg | DELAYED_RELEASE_TABLET | Freq: Every day | ORAL | Status: DC

## 2024-08-13 MED ORDER — PRASUGREL HCL 10 MG PO TABS
10.0000 mg | ORAL_TABLET | Freq: Every day | ORAL | Status: DC
  Administered 2024-08-14: 10 mg via ORAL
  Filled 2024-08-13: qty 1

## 2024-08-13 MED ORDER — ASPIRIN 81 MG PO TBEC
81.0000 mg | DELAYED_RELEASE_TABLET | Freq: Every day | ORAL | Status: DC
  Administered 2024-08-14: 81 mg via ORAL
  Filled 2024-08-13: qty 1

## 2024-08-13 MED ORDER — ATORVASTATIN CALCIUM 20 MG PO TABS
80.0000 mg | ORAL_TABLET | Freq: Every day | ORAL | Status: DC
  Administered 2024-08-14: 80 mg via ORAL
  Filled 2024-08-13: qty 4

## 2024-08-13 MED ORDER — PERFLUTREN LIPID MICROSPHERE
1.0000 mL | INTRAVENOUS | Status: AC | PRN
  Administered 2024-08-13: 2 mL via INTRAVENOUS

## 2024-08-13 MED ORDER — HEPARIN (PORCINE) 25000 UT/250ML-% IV SOLN: 1200.0000 [IU]/h | INTRAVENOUS | Status: DC

## 2024-08-13 MED ORDER — ONDANSETRON HCL 4 MG/2ML IJ SOLN: 4.0000 mg | Freq: Four times a day (QID) | INTRAMUSCULAR | Status: DC | PRN

## 2024-08-13 MED ORDER — ASPIRIN 81 MG PO CHEW
324.0000 mg | CHEWABLE_TABLET | Freq: Once | ORAL | Status: AC
  Administered 2024-08-13: 324 mg via ORAL
  Filled 2024-08-13: qty 4

## 2024-08-13 MED ORDER — SACUBITRIL-VALSARTAN 24-26 MG PO TABS
1.0000 | ORAL_TABLET | Freq: Two times a day (BID) | ORAL | Status: DC
  Administered 2024-08-13 – 2024-08-14 (×2): 1 via ORAL
  Filled 2024-08-13 (×2): qty 1

## 2024-08-13 NOTE — ED Notes (Signed)
 Called CCMD to add pt to monitoring.

## 2024-08-13 NOTE — ED Provider Notes (Addendum)
 [facility] Provider Note    Event Date/Time   First MD Initiated Contact with Patient 08/13/24 [phone]     (approximate)   History   Chest Pain   HPI  Aaron Avila is a 53 y.o. male   CAD s/p DES x2  to prox-mid RCA in 07/2024, STEMI, NSVT in the setting of ischemic cardiomyopathy/HFrEF (ECHO 07/29/2024: Ef 35-40%), HTN    Patient reports compliance with his medications.  Took all of his home medications already for the morning.  He was at work today and around 5:30 he was walking at work had to walk a long distance and he started developing a feeling of chest pressure in the central chest, also slight feeling of palpitation.  Reports it feels very similar to when he had his heart attack about a month ago.  It is not as severe in intensity and right now it is very mild but was more moderate earlier.  The intensity is improved.  He did take Tylenol  prior to arrival        Physical Exam   Triage Vital Signs: ED Triage Vitals [08/13/24 0744]  Encounter Vitals Group     BP 133/82     Girls Systolic BP Percentile      Girls Diastolic BP Percentile      Boys Systolic BP Percentile      Boys Diastolic BP Percentile      Pulse Rate 88     Resp 20     Temp 99.8 F (37.7 C)     Temp Source Oral     SpO2 96 %     Weight 245 lb (111.1 kg)     Height 6' (1.829 m)     Head Circumference      Peak Flow      Pain Score 4     Pain Loc      Pain Education      Exclude from Growth Chart     Most recent vital signs: Vitals:   08/13/24 0845 08/13/24 0900  BP: 116/68 112/66  Pulse: 82 80  Resp: 14 (!) 23  Temp:    SpO2: 96% 97%     General: Awake, no distress.  CV:  Good peripheral perfusion.  Normal tones Resp:  Normal effort.  Clear bilateral Abd:  No distention.  Obese but soft nontender nondistended Other:  No notable lower extremity edema   ED Results / Procedures / Treatments   Labs (all labs ordered are listed, but only abnormal results  are displayed) Labs Reviewed  BASIC METABOLIC PANEL WITH GFR - Abnormal; Notable for the following components:      Result Value   Glucose, Bld 102 (*)    BUN 21 (*)    All other components within normal limits  RESP PANEL BY RT-PCR (RSV, FLU A&B, COVID)  [id number]  CBC  TROPONIN I (HIGH SENSITIVITY)  TROPONIN I (HIGH SENSITIVITY)     EKG  ED ECG REPORT I, Oneil Budge, the attending physician, personally viewed and interpreted this ECG.  Date: 08/13/2024 EKG Time: 740 Rate: 90 Rhythm: normal sinus rhythm QRS Axis: normal Intervals: normal ST/T Wave abnormalities: normal Narrative Interpretation: no evidence of acute ischemia    RADIOLOGY  DG Chest Portable 1 View Result Date: 08/13/2024 CLINICAL DATA:  Chest pain. EXAM: PORTABLE CHEST 1 VIEW COMPARISON:  07/28/2024 and CT chest 08/30/2022. FINDINGS: Trachea is midline. Heart size stable. Lungs are low in volume but clear. No  pleural fluid. Postoperative changes in the cervical spine. IMPRESSION: No acute findings. Electronically Signed   By: Newell Eke M.D.   On: 08/13/2024 08:32    Chest x-ray inter by me is negative for acute finding  PROCEDURES:  Critical Care performed: Yes, see critical care procedure note(s)  Procedures   MEDICATIONS ORDERED IN ED: Medications  aspirin  chewable tablet 324 mg (324 mg Oral Given 08/13/24 0805)  nitroGLYCERIN  (NITROGLYN) 2 % ointment 0.5 inch (0.5 inches Topical Given 08/13/24 0926)   CRITICAL CARE Performed by: Oneil Budge   Total critical care time: 30 minutes  Critical care time was exclusive of separately billable procedures and treating other patients.  Critical care was necessary to treat or prevent imminent or life-threatening deterioration.  Critical care was time spent personally by me on the following activities: development of treatment plan with patient and/or surrogate as well as nursing, discussions with consultants, evaluation of patient's response to  treatment, examination of patient, obtaining history from patient or surrogate, ordering and performing treatments and interventions, ordering and review of laboratory studies, ordering and review of radiographic studies, pulse oximetry and re-evaluation of patient's condition.   IMPRESSION / MDM / ASSESSMENT AND PLAN / ED COURSE  I reviewed the triage vital signs and the nursing notes.                              Differential diagnosis includes, but is not limited to, ACS, aortic dissection, pulmonary embolism, cardiac tamponade, pneumothorax, pneumonia, pericarditis, myocarditis, GI-related causes including esophagitis/gastritis, and musculoskeletal chest wall pain.    Does not take any PDE 5 inhibitors.  Blood pressure appropriate for treatment with nitroglycerin .  Will give aspirin  and nitroglycerin .  ----------------------------------------- 8:05 AM on 08/13/2024 ----------------------------------------- Cardiology consult has been requested as the patient has a recent history of STEMI now presenting with symptoms that are concerning for potential cardiac cause  He does not have any lower extremity edema, he does not have any pleuritic pain he is not hypoxic or tachycardic.  I think the likelihood of acute PE pneumothorax pericarditis myocarditis dissection is quite low.  Suspect most likely this could represent development of angina or unstable angina.  Awaiting cardiac biomarker testing and cardiology consult  Patient's presentation is most consistent with acute presentation with potential threat to life or bodily function.  Additionally has a history of nonsustained V. tach.  Given he had some palpitations around the time, we will have him on cardiac monitoring.  The patient is on the cardiac monitor to evaluate for evidence of arrhythmia and/or significant heart rate changes.  Clinical Course as of 08/13/24 1004  Wed Aug 13, 2024  9240 Sent message to cardiology, [name]  requesting cardiology consult.  Patient has multiple cardiac risk factors including recent STEMI was history of nonsustained V. tach.  [MQ]  [id number] Patient received 1 nitro tab, blood pressure dropped to 100 systolic.  Reports he is feeling better but still a very slight sense of chest pressure.  Will continue to monitor at this point, if blood pressure begins to rise we will likely start Nitropaste [MQ]  0939 Patient reports some mild return of chest discomfort.  Was feeling very minimal but now seems to be slightly worsening.  He is in no distress.  Repeat EKG at this time shows mild chest less than 1 mm of ST elevation in lead II and aVF.  Does not meet STEMI criteria.  I  have sent to Dr. Gollan who is reviewing [MQ]  [phone] Dr. Gollan reviewed initial and second EKG (0930). Advises start heparin , cardiology to consult. No stemi activation at this time. Rule out for ACS in hospital. Dr. Roann admitting.  [MQ]  1003 Started on heparin  per recommendation of Dr. Gollan [MQ]    Clinical Course User Index [MQ] Dicky Anes, MD     FINAL CLINICAL IMPRESSION(S) / ED DIAGNOSES   Final diagnoses:  Chest pain with high risk of acute coronary syndrome     Rx / DC Orders   ED Discharge Orders     None        Note:  This document was prepared using Dragon voice recognition software and may include unintentional dictation errors.   Dicky Anes, MD 08/13/24 9056    Dicky Anes, MD 08/13/24 1004

## 2024-08-13 NOTE — ED Notes (Addendum)
 Pt anxious upon arrival to exam room, stated he had two stents placed a few weeks ago. They had discussed placing a 3rd, pt unsure why did not receive 3rd. Pt c/o chest tightness. Denies SOB and pt A&Ox4.

## 2024-08-13 NOTE — H&P (Signed)
 History and Physical    Aaron Avila FMW:[id number] DOB: 11/25/1970 DOA: 08/13/2024  DOS: the patient was seen and examined on 08/13/2024  PCP: Glover Lenis, MD   Patient coming from: Home  I have personally briefly reviewed patient's old medical records in [facility]  Chief Complaint: Chest pain  HPI: Aaron Avila is a pleasant 53 y.o. male with medical history significant for CAD s/p DES x2  to prox-mid RCA in 07/2024, STEMI, NSVT in the setting of ischemic ardiomyopathy/HFrEF (ECHO 07/29/2024: Ef 35-40%), HTN who came into ED complaining of chest pain at work started at 6:30 this morning.  Patient stated that   he woke up at 4:30 AM and thought that he had something wrong in his chest.  But he went to work.  At around 6:30 AM this morning while he was walking at his workplace, he started feeling some chest discomfort and slight palpitations.  He chest discomfort was pressure-like 3-4/10 in intensity, similar to his previous heart attack.  He did take Tylenol  prior to arrival and chest pressure improved slightly.  He denies any fever or chills.  He denies any vomiting but had some nausea.  He denies any cough, leg edema, headache.  ED Course: Upon arrival to the ED, patient is found to be in chest pain, with normal vitals, negative chest x-ray for acute pathology initial EKG was not different.  Patient was given aspirin  324 mg, nitroglycerin  half inches topical.  Patient complained of chest pain again and second EKG showed minimal ST elevation in lead II and aVF.  Cardiologist was contacted by ED and they advised to start patient on heparin  and cardiology evaluation to rule out ACS.  Hospitalist service was consulted for evaluation for admission.  Review of Systems:  ROS  All other systems negative except as noted in the HPI.  Past Medical History:  Diagnosis Date   Myocardial infarction [facility])     Past Surgical History:  Procedure Laterality Date   CERVICAL SPINE SURGERY      COLONOSCOPY WITH PROPOFOL  N/A 03/09/2023   Procedure: COLONOSCOPY WITH PROPOFOL ;  Surgeon: Onita Elspeth Sharper, DO;  Location: [facility] ENDOSCOPY;  Service: Gastroenterology;  Laterality: N/A;   CORONARY/GRAFT ACUTE MI REVASCULARIZATION N/A 07/28/2024   Procedure: Coronary/Graft Acute MI Revascularization;  Surgeon: Wonda Sharper, MD;  Location: [facility] INVASIVE CV LAB;  Service: Cardiovascular;  Laterality: N/A;   LEFT HEART CATH AND CORONARY ANGIOGRAPHY N/A 07/28/2024   Procedure: LEFT HEART CATH AND CORONARY ANGIOGRAPHY;  Surgeon: Wonda Sharper, MD;  Location: [facility] INVASIVE CV LAB;  Service: Cardiovascular;  Laterality: N/A;     reports that he has been smoking cigarettes. He has never used smokeless tobacco. He reports current alcohol use. He reports current drug use. Drug: Marijuana.  No Known Allergies  History reviewed. No pertinent family history.  Prior to Admission medications   Medication Sig Start Date End Date Taking? Authorizing Provider  Acetaminophen  (TYLENOL  PO) Take 2-3 tablets by mouth 2 (two) times daily as needed (headache, pain).    [provider]  aspirin  EC 81 MG tablet Take 1 tablet (81 mg total) by mouth daily. Swallow whole. 07/30/24 07/30/25  Patwardhan, Newman JINNY, MD  atorvastatin  (LIPITOR) 80 MG tablet Take 1 tablet (80 mg total) by mouth daily. 07/30/24   Patwardhan, Newman JINNY, MD  empagliflozin  (JARDIANCE ) 10 MG TABS tablet Take 1 tablet (10 mg total) by mouth daily. 07/30/24   Patwardhan, Newman JINNY, MD  metoprolol  succinate (TOPROL -XL) 25 MG  24 hr tablet Take 1 tablet (25 mg total) by mouth daily. 07/30/24   Patwardhan, Newman PARAS, MD  nitroGLYCERIN  (NITROSTAT ) 0.4 MG SL tablet Place 1 tablet (0.4 mg total) under the tongue every 5 (five) minutes x 3 doses as needed for chest pain. 07/30/24   Patwardhan, Newman PARAS, MD  omeprazole (PRILOSEC OTC) 20 MG tablet Take 20 mg by mouth daily.    [provider]  prasugrel  (EFFIENT ) 10 MG TABS tablet Take 1 tablet (10 mg  total) by mouth daily. 07/30/24   Patwardhan, Newman PARAS, MD  sacubitril -valsartan  (ENTRESTO ) 24-26 MG Take 1 tablet by mouth 2 (two) times daily. 07/30/24   Patwardhan, Newman PARAS, MD  spironolactone  (ALDACTONE ) 25 MG tablet Take 1 tablet (25 mg total) by mouth daily. 08/06/24   Sheron Lorette GRADE, PA-C    Physical Exam: Vitals:   08/13/24 0900 08/13/24 0930 08/13/24 1000 08/13/24 1030  BP: 112/66 106/75 115/75 120/87  Pulse: 80 79 77 96  Resp: (!) 23 (!) 21 18 19   Temp:      TempSrc:      SpO2: 97% 97% 97% 97%  Weight:      Height:        Physical Exam   Constitutional: Alert, awake, calm, comfortable HEENT: Neck supple Respiratory: Clear to auscultation B/L, no wheezing, no rales.  Cardiovascular: Regular rate and rhythm, no murmurs / rubs / gallops. No extremity edema. 2+ pedal pulses. No carotid bruits.  Abdomen: Soft, no tenderness, Bowel sounds positive.  Musculoskeletal: no clubbing / cyanosis. Good ROM, no contractures. Normal muscle tone.  Skin: no rashes, lesions, ulcers. Neurologic: CN 2-12 grossly intact. Sensation intact, No focal deficit identified Psychiatric: Alert and oriented x 3. Normal mood.    Labs on Admission: I have personally reviewed following labs and imaging studies  CBC: Recent Labs  Lab 08/13/24 0802  WBC 9.3  HGB 13.9  HCT 41.9  MCV 92.1  PLT 324   Basic Metabolic Panel: Recent Labs  Lab 08/13/24 0802  NA 137  K 4.7  CL 105  CO2 23  GLUCOSE 102*  BUN 21*  CREATININE 0.82  CALCIUM  9.1   GFR: Estimated Creatinine Clearance: 132.6 mL/min (by C-G formula based on SCr of 0.82 mg/dL). Liver Function Tests: No results for input(s): AST, ALT, ALKPHOS, BILITOT, PROT, ALBUMIN in the last 168 hours. No results for input(s): LIPASE, AMYLASE in the last 168 hours. No results for input(s): AMMONIA in the last 168 hours. Coagulation Profile: No results for input(s): INR, PROTIME in the last 168 hours. Cardiac  Enzymes: Recent Labs  Lab 08/13/24 0802 08/13/24 0953  TROPONINIHS 9 10   BNP (last 3 results) No results for input(s): BNP in the last 8760 hours. HbA1C: No results for input(s): HGBA1C in the last 72 hours. CBG: No results for input(s): GLUCAP in the last 168 hours. Lipid Profile: No results for input(s): CHOL, HDL, LDLCALC, TRIG, CHOLHDL, LDLDIRECT in the last 72 hours. Thyroid Function Tests: No results for input(s): TSH, T4TOTAL, FREET4, T3FREE, THYROIDAB in the last 72 hours. Anemia Panel: No results for input(s): VITAMINB12, FOLATE, FERRITIN, TIBC, IRON, RETICCTPCT in the last 72 hours. Urine analysis: No results found for: COLORURINE, APPEARANCEUR, LABSPEC, PHURINE, GLUCOSEU, HGBUR, BILIRUBINUR, KETONESUR, PROTEINUR, UROBILINOGEN, NITRITE, LEUKOCYTESUR  Radiological Exams on Admission: I have personally reviewed images DG Chest Portable 1 View Result Date: 08/13/2024 CLINICAL DATA:  Chest pain. EXAM: PORTABLE CHEST 1 VIEW COMPARISON:  07/28/2024 and CT chest 08/30/2022. FINDINGS: Trachea is midline.  Heart size stable. Lungs are low in volume but clear. No pleural fluid. Postoperative changes in the cervical spine. IMPRESSION: No acute findings. Electronically Signed   By: Newell Eke M.D.   On: 08/13/2024 08:32    EKG: My personal interpretation of EKG shows: Repeat EKG after chest pain shortness 1 mm ST elevation in lead II and aVF.    Assessment/Plan Principal Problem:   Chest pain Active Problems:   Nicotine dependence   Familial hypercholesterolemia    Assessment and Plan: This is a 53 year old male with history of CAD s/p recent stenting, ischemic cardiomyopathy last EF was 35 to 40%, HTN, obesity who came into ED complaining of chest discomfort started this morning.  1.  Chest pain/unstable angina - Patient will be placed in observation. - He will be started on heparin  drip, already received  aspirin  and nitro - Telemetry, trend troponin, EKG - Cardiology evaluation - Continue his home medications for coronary artery disease  2.  Ischemic cardiomyopathy - Continue Entresto  and spironolactone  - Follow-up cardiology recommendation  3.  HTN - Stable - Continue metoprolol  25 mg daily  4.  GERD - Placed on Protonix patient usually takes Prilosec at home    DVT prophylaxis: IV heparin  gtts Code Status: Full Code Family Communication: None Disposition Plan: Home Consults called: Cardiology Admission status: Observation, Telemetry bed   Nena Rebel, MD [facility] 08/13/2024, 10:59 AM

## 2024-08-13 NOTE — ED Notes (Signed)
 Pt given eye mask for comfort to sleep.

## 2024-08-13 NOTE — ED Notes (Signed)
 Pt states chest tightness is now 4/10. EDP Quale notified and new orders received.

## 2024-08-13 NOTE — Consult Note (Signed)
 Cardiology Consultation   Patient ID: HUSSAM MUNIZ MRN: [id number]; DOB: [birth year]  Admit date: 08/13/2024 Date of Consult: 08/13/2024  PCP:  Glover Lenis, MD   [facility] Providers Cardiologist:  Newman JINNY Lawrence, MD        Patient Profile: Aaron Avila is a 53 y.o. male with a hx of coronary artery disease with recent inferior lateral STEMI (PCI/DES prox-mid RCA 07/2024), NSVT, obesity, tobacco use, ischemic cardiomyopathy/HFrEF, familial hypercholesterolemia, who is being seen 08/13/2024 for the evaluation of chest pain at the request of Dr. Dicky.  History of Present Illness: Mr. Age had previously presented to the emergency department at 80 bpm and 07/26/2024 with episodes of pressure-like chest discomfort-elephant sitting on his chest that it started in the morning and was initially intermittent.  He came back from work around 5 PM and the pain and discomfort become constant and more severe which prompted his visit to the ED.  Stated that his chest pain radiated into his back and had been associated with shortness of breath but without nausea vomiting or diaphoresis.  He had also reported that for about 2 to 3 weeks he been having increasing fatigue and dyspnea on exertion.  In the emergency department his EKG revealed inferior lateral STEMI.  He remained hemodynamically stable.  He received aspirin  324 mg and heparin  4000 units and was subsequently transferred to [facility] as a STEMI and underwent emergent catheterization.  Acute inferior STEMI secondary to total occlusion of the distal RCA.  TIA successful PCI of the distal vessel culprit lesion.  Severe proximal RCA stenosis 75% stenosis with TIMI-3 flow post PCI.  Widely patent left main with no stenosis.  Patent LAD with moderate LAD/first diagonal bifurcation disease appropriate for medical therapy.  Patent circumflex and intermittent branches with no significant stenosis.  Severe hypokinesis of the inferior  wall with moderately reduced LVEF 45% moderately elevated LVEDP 28 mmHg.  Recommendations were for DAPT with aspirin  and Effient  x 12 months without interruption.  Echocardiogram was completed which revealed LVEF of 35-40% which was moderately decreased function.  The left ventricle demonstrated wall motion abnormalities.  Left ventricle diastolic parameters were normal.  LAD and RCA territory wall motion abnormalities were not well-visualized in all views and basal function was preserved.  Right ventricular systolic function is normal and right ventricular size was normal.  There were no evidence of valvular abnormalities noted.He was started on Entresto  24/26 mg twice daily and metoprolol  succinate 25 mg daily, Jardiance  10 mg daily, and spironolactone  25 mg daily.  Recommendation was to repeat echocardiogram in 3 months.  He did have nonsustained ventricular tachycardia and the recommendation was to optimize medical management of ischemic cardiomyopathy and there was no indication for LifeVest at that time.  He presented to the [facility] emergency department this morning with complaints of intermittent chest discomfort that started on his way to work.  He stated he had been compliant with his current medications.  Taking all of his home medications already this morning.  Was at work around 530 and was walking into work he was walking long distance and started developing feeling of chest pressure central chest and a slight feeling of palpitations.  Reported felt similar to when he had his heart attack previously a month ago.  It was not as severe in intensity.  With the sensation of pressure substernally he became scared and presented to the emergency department for further evaluation.  Initial vital signs reveal blood pressure 133/82,  pulse of 88, respiration of 20, temperature 99.8.  Labs revealed blood glucose 102, BUN 21, high-sensitivity troponin trended 9 and 10.  EKG revealed sinus rhythm with a rate of 90  with no evidence of acute ischemia.  Chest x-ray showed no acute findings.  He was treated with aspirin  324 mg and half a inch of Nitropaste.  There were changes noted to his EKG with diffuse ST-T changes concerning for pericarditis or myocarditis.  With recurrence of chest discomfort patient was ordered to be started on heparin  infusion.  Cardiology was consulted for assistance in management of ongoing chest discomfort.   Past Medical History:  Diagnosis Date   Myocardial infarction [facility])     Past Surgical History:  Procedure Laterality Date   CERVICAL SPINE SURGERY     COLONOSCOPY WITH PROPOFOL  N/A 03/09/2023   Procedure: COLONOSCOPY WITH PROPOFOL ;  Surgeon: Onita Elspeth Sharper, DO;  Location: [facility] ENDOSCOPY;  Service: Gastroenterology;  Laterality: N/A;   CORONARY/GRAFT ACUTE MI REVASCULARIZATION N/A 07/28/2024   Procedure: Coronary/Graft Acute MI Revascularization;  Surgeon: Wonda Sharper, MD;  Location: [facility] INVASIVE CV LAB;  Service: Cardiovascular;  Laterality: N/A;   LEFT HEART CATH AND CORONARY ANGIOGRAPHY N/A 07/28/2024   Procedure: LEFT HEART CATH AND CORONARY ANGIOGRAPHY;  Surgeon: Wonda Sharper, MD;  Location: [facility] INVASIVE CV LAB;  Service: Cardiovascular;  Laterality: N/A;     Home Medications:  Prior to Admission medications   Medication Sig Start Date End Date Taking? Authorizing Provider  Acetaminophen  (TYLENOL  PO) Take 2-3 tablets by mouth 2 (two) times daily as needed (headache, pain).   Yes [provider]  aspirin  EC 81 MG tablet Take 1 tablet (81 mg total) by mouth daily. Swallow whole. 07/30/24 07/30/25 Yes Patwardhan, Manish J, MD  atorvastatin  (LIPITOR) 80 MG tablet Take 1 tablet (80 mg total) by mouth daily. 07/30/24  Yes Patwardhan, Manish J, MD  empagliflozin  (JARDIANCE ) 10 MG TABS tablet Take 1 tablet (10 mg total) by mouth daily. 07/30/24  Yes Patwardhan, Newman PARAS, MD  metoprolol  succinate (TOPROL -XL) 25 MG 24 hr tablet Take 1 tablet (25 mg total) by  mouth daily. 07/30/24  Yes Patwardhan, Manish J, MD  nitroGLYCERIN  (NITROSTAT ) 0.4 MG SL tablet Place 1 tablet (0.4 mg total) under the tongue every 5 (five) minutes x 3 doses as needed for chest pain. 07/30/24  Yes Patwardhan, Manish J, MD  omeprazole (PRILOSEC OTC) 20 MG tablet Take 20 mg by mouth daily.   Yes [provider]  prasugrel  (EFFIENT ) 10 MG TABS tablet Take 1 tablet (10 mg total) by mouth daily. 07/30/24  Yes Patwardhan, Manish J, MD  sacubitril -valsartan  (ENTRESTO ) 24-26 MG Take 1 tablet by mouth 2 (two) times daily. 07/30/24  Yes Patwardhan, Manish J, MD  spironolactone  (ALDACTONE ) 25 MG tablet Take 1 tablet (25 mg total) by mouth daily. 08/06/24  Yes Dunlap, Lorette GRADE, PA-C    Scheduled Meds:  [START ON 08/14/2024] aspirin  EC  81 mg Oral Daily   [START ON 08/14/2024] atorvastatin   80 mg Oral Daily   colchicine  0.6 mg Oral BID   [START ON 08/14/2024] metoprolol  succinate  25 mg Oral Daily   [START ON 08/14/2024] prasugrel   10 mg Oral Daily   sacubitril -valsartan   1 tablet Oral BID   [START ON 08/14/2024] spironolactone   25 mg Oral Daily   Continuous Infusions:   PRN Meds: acetaminophen , nitroGLYCERIN , ondansetron  (ZOFRAN ) IV  Allergies:   No Known Allergies  Social History:   Social History   Socioeconomic History  Marital status: Single    Spouse name: Not on file   Number of children: Not on file   Years of education: Not on file   Highest education level: Not on file  Occupational History   Not on file  Tobacco Use   Smoking status: Every Day    Current packs/day: 1.00    Types: Cigarettes   Smokeless tobacco: Never  Vaping Use   Vaping status: Never Used  Substance and Sexual Activity   Alcohol use: Yes    Comment: occ   Drug use: Yes    Types: Marijuana    Comment: 2 MONTHS AGO   Sexual activity: Not on file  Other Topics Concern   Not on file  Social History Narrative   Not on file   Social Drivers of Health   Financial Resource Strain:  Low Risk  (12/18/2023)   Received from [facility]   Overall Financial Resource Strain (CARDIA)    Difficulty of Paying Living Expenses: Not very hard  Food Insecurity: No Food Insecurity (07/28/2024)   Hunger Vital Sign    Worried About Running Out of Food in the Last Year: Never true    Ran Out of Food in the Last Year: Never true  Transportation Needs: No Transportation Needs (07/28/2024)   PRAPARE - Administrator, Civil Service (Medical): No    Lack of Transportation (Non-Medical): No  Physical Activity: Not on file  Stress: Not on file  Social Connections: Not on file  Intimate Partner Violence: Not At Risk (07/28/2024)   Humiliation, Afraid, Rape, and Kick questionnaire    Fear of Current or Ex-Partner: No    Emotionally Abused: No    Physically Abused: No    Sexually Abused: No    Family History:   History reviewed. No pertinent family history.   ROS:  Please see the history of present illness.  Review of Systems  Constitutional:  Positive for diaphoresis.  Respiratory:  Positive for shortness of breath.   Cardiovascular:  Positive for chest pain.  Neurological:  Positive for weakness.    All other ROS reviewed and negative.     Physical Exam/Data: Vitals:   08/13/24 1000 08/13/24 1030 08/13/24 1100 08/13/24 1130  BP: 115/75 120/87 98/62 97/69   Pulse: 77 96 81 82  Resp: 18 19 13 17   Temp:      TempSrc:      SpO2: 97% 97% 97% 96%  Weight:      Height:       No intake or output data in the 24 hours ending 08/13/24 1244    08/13/2024    7:44 AM 08/06/2024    2:47 PM 07/28/2024   11:00 PM  Last 3 Weights  Weight (lbs) 245 lb 249 lb 3.2 oz 251 lb 8.7 oz  Weight (kg) 111.131 kg 113.036 kg 114.1 kg     Body mass index is 33.23 kg/m.  General:  Well nourished, well developed, in no acute distress HEENT: normal Neck: no JVD Vascular: No carotid bruits; Distal pulses 2+ bilaterally Cardiac:  normal S1, S2; RRR; no murmur  Lungs:   clear to auscultation bilaterally, no wheezing, rhonchi or rales  Abd: soft, nontender, no hepatomegaly  Ext: no edema Musculoskeletal:  No deformities, BUE and BLE strength normal and equal Skin: warm and dry  Neuro:  CNs 2-12 intact, no focal abnormalities noted Psych:  Normal affect   EKG:  The EKG was personally reviewed and demonstrates: Sinus  rhythm with rate 89 with ST changes in 2, 3, V4 through V6 Telemetry:  Telemetry was personally reviewed and demonstrates: Sinus rhythm with rates in the 80s  Relevant CV Studies:  2d echo 07/29/2024  1. Left ventricular ejection fraction, by estimation, is 35 to 40%. The  left ventricle has moderately decreased function. The left ventricle  demonstrates regional wall motion abnormalities (see scoring  diagram/findings for description). Left ventricular   diastolic parameters were normal. LAD and RCA territory wall motion  abnormalities; not well visualized in all views basal function is  preserved.   2. Right ventricular systolic function is normal. The right ventricular  size is normal. Tricuspid regurgitation signal is inadequate for assessing  PA pressure.   3. The mitral valve was not well visualized. No evidence of mitral valve  regurgitation. No evidence of mitral stenosis.   4. The aortic valve was not well visualized. Aortic valve regurgitation  is not visualized. No aortic stenosis is present.   5. The inferior vena cava is dilated in size with >50% respiratory  variability, suggesting right atrial pressure of 8 mmHg.   LHC 07/28/2024   LV end diastolic pressure is moderately elevated.   The left ventricular ejection fraction is 45-50% by visual estimate.   1.  Acute inferior STEMI secondary to total occlusion of the distal RCA.  Successful PCI of the distal vessel culprit lesion using a 3.0 x 24 mm Synergy DES, treated 100% stenosis and TIMI 0 flow, reduced to 0% stenosis and TIMI-3 flow. 2.  Severe proximal RCA stenosis of 75%  treated with a 3.5 x 24 mm Synergy DES, reducing percent stenosis to 0 and TIMI-3 flow post PCI 3.  Widely patent left main with no stenosis 4.  Patent LAD with moderate LAD/first diagonal bifurcation disease appropriate for medical therapy 5.  Patent circumflex and intermediate branches with no significant stenoses 6.  Severe hypokinesis of the inferior wall with moderately reduced LVEF of 45%, moderately elevated LVEDP 28 mmHg   Recommendations: DAPT with aspirin  and Effient  x 12 months without interruption.  Effient  60 mg loaded orally on the table.  2D echocardiogram for reassessment of LVEF and presence of valvular disease.  Post MI medical therapy.  Diagnostic Dominance: Right  Intervention    Laboratory Data: High Sensitivity Troponin:   Recent Labs  Lab 07/28/24 2039 07/28/24 2355 08/13/24 0802 08/13/24 0953  TROPONINIHS 960* 5,277* 9 10     Chemistry Recent Labs  Lab 08/13/24 0802  NA 137  K 4.7  CL 105  CO2 23  GLUCOSE 102*  BUN 21*  CREATININE 0.82  CALCIUM  9.1  GFRNONAA >60  ANIONGAP 9    No results for input(s): PROT, ALBUMIN, AST, ALT, ALKPHOS, BILITOT in the last 168 hours. Lipids No results for input(s): CHOL, TRIG, HDL, LABVLDL, LDLCALC, CHOLHDL in the last 168 hours.  Hematology Recent Labs  Lab 08/13/24 0802  WBC 9.3  RBC 4.55  HGB 13.9  HCT 41.9  MCV 92.1  MCH 30.5  MCHC 33.2  RDW 12.4  PLT 324   Thyroid No results for input(s): TSH, FREET4 in the last 168 hours.  BNPNo results for input(s): BNP, PROBNP in the last 168 hours.  DDimer No results for input(s): DDIMER in the last 168 hours.  Radiology/Studies:  DG Chest Portable 1 View Result Date: 08/13/2024 CLINICAL DATA:  Chest pain. EXAM: PORTABLE CHEST 1 VIEW COMPARISON:  07/28/2024 and CT chest 08/30/2022. FINDINGS: Trachea is midline. Heart size stable. Lungs  are low in volume but clear. No pleural fluid. Postoperative changes in the cervical  spine. IMPRESSION: No acute findings. Electronically Signed   By: Newell Eke M.D.   On: 08/13/2024 08:32     Assessment and Plan: Chest pain/coronary artery disease with recent inferior STEMI -Inferior STEMI 07/29/2019 follow-up with successful PCI/DES to the distal RCA -Patient presented with complaints of chest pain similar to previous chest discomfort -Received aspirin  and nitro and Nitropaste placed - Currently remains chest pain-free -Initial EKG was unrevealing -Subsequent EKG shows diffuse ST changes concerning for pericarditis -Limited echocardiogram ordered to rule out pericardial effusion -Started on colchicine 0.6 mg twice daily -Continued on aspirin  81 mg daily and Effient  10 mg daily -Continue on atorvastatin  80 mg daily - Continue with telemetry monitoring -EKG as needed for pain or changes - High-sensitivity troponin negative x 2 -Heparin  infusion discontinued with negative troponins and resolution of pain  Ischemic cardiomyopathy/HFrEF -Prior echo 07/28/2024 with an LVEF 35-40%, severe inferior hypokinesis, normal LV function, dilated IVC, no significant valvular abnormalities -Has been continued on PTA medications of Entresto  24/26 mg twice daily, Toprol -XL 25 mg daily, and Jardiance  10 mg daily as well as spironolactone  25 mg daily - Limited echocardiogram ordered today to evaluate for pericardial effusion - Continue with heart failure education - Daily weights and I's and O's  Hypertension -Blood pressure 97/69 -Blood pressure slightly soft -Continue PTA medications patient has had his daily medications today -Blood pressure continues to remain soft consider removing Nitro paste -Vital signs per unit protocol  Palpitations/NSVT -Patient complains of palpitations prior to arrival to the emergency department. -In the setting of his ischemic cardiomyopathy he was noted on recent hospitalization to have NSVT -Was to be optimized medically no indication for  LifeVest -Continue on Toprol -XL  5.    Tobacco abuse -Total cessation continues to be recommended   Risk Assessment/Risk Scores:    TIMI Risk Score for Unstable Angina or Non-ST Elevation MI:   The patient's TIMI risk score is 3, which indicates a 13% risk of all cause mortality, new or recurrent myocardial infarction or need for urgent revascularization in the next 14 days.  New [place]  Heart Association (NYHA) Functional Class NYHA Class II       For questions or updates, please contact [facility] Please consult [email or web address] for contact info under      Signed, Maryrose Colvin, NP  08/13/2024 12:44 PM

## 2024-08-13 NOTE — ED Triage Notes (Signed)
 Patient reports intermittent chest discomfort that started this morning while working; had two stents placed on 07/28/2024.

## 2024-08-13 NOTE — Progress Notes (Signed)
 PHARMACY - ANTICOAGULATION CONSULT NOTE  Pharmacy Consult for heparin  infusion  Indication: chest pain/ACS  No Known Allergies  Patient Measurements: Height: 6' (182.9 cm) Weight: 111.1 kg (245 lb) IBW/kg (Calculated) : 77.6 HEPARIN  DW (KG): 101.2  Vital Signs: Temp: 99.8 F (37.7 C) (10/08 0744) Temp Source: Oral (10/08 0744) BP: 120/87 (10/08 1030) Pulse Rate: 96 (10/08 1030)  Labs: Recent Labs    08/13/24 0802 08/13/24 0953  HGB 13.9  --   HCT 41.9  --   PLT 324  --   CREATININE 0.82  --   TROPONINIHS 9 10    Estimated Creatinine Clearance: 132.6 mL/min (by C-G formula based on SCr of 0.82 mg/dL).   Medical History: Past Medical History:  Diagnosis Date   Myocardial infarction [facility])     Assessment: 53 yo male with PMH CAD, STEMI, HFeEF, and HTN admitted with chest pain. Pharmacy consulted for heparin  infusion dosing and monitoring for ACS.   Goal of Therapy:  Heparin  level 0.3-0.7 units/ml Monitor platelets by anticoagulation protocol: Yes   Plan:  Baseline labs ordered Give 4000 units bolus x 1 Start heparin  infusion at 1200 units/hr Check anti-Xa level in 6 hours and daily while on heparin  Continue to monitor H&H and platelets  Estill CHRISTELLA Lutes, PharmD, BCPS Clinical Pharmacist 08/13/2024 11:04 AM

## 2024-08-14 ENCOUNTER — Other Ambulatory Visit: Payer: Self-pay

## 2024-08-14 DIAGNOSIS — I25118 Atherosclerotic heart disease of native coronary artery with other forms of angina pectoris: Secondary | ICD-10-CM | POA: Diagnosis not present

## 2024-08-14 DIAGNOSIS — R079 Chest pain, unspecified: Secondary | ICD-10-CM | POA: Diagnosis not present

## 2024-08-14 DIAGNOSIS — F17210 Nicotine dependence, cigarettes, uncomplicated: Secondary | ICD-10-CM | POA: Diagnosis not present

## 2024-08-14 DIAGNOSIS — I252 Old myocardial infarction: Secondary | ICD-10-CM | POA: Diagnosis not present

## 2024-08-14 LAB — BASIC METABOLIC PANEL WITH GFR
Anion gap: 9 (ref 5–15)
BUN: 17 mg/dL (ref 6–20)
CO2: 24 mmol/L (ref 22–32)
Calcium: 8.7 mg/dL — ABNORMAL LOW (ref 8.9–10.3)
Chloride: 104 mmol/L (ref 98–111)
Creatinine, Ser: 0.77 mg/dL (ref 0.61–1.24)
GFR, Estimated: >60 mL/min (ref >60)
Glucose, Bld: 106 mg/dL — ABNORMAL HIGH (ref 70–99)
Potassium: 4.4 mmol/L (ref 3.5–5.1)
Sodium: 137 mmol/L (ref 135–145)

## 2024-08-14 LAB — CBC
HCT: 43.1 % (ref 39.0–52.0)
Hemoglobin: 14.1 g/dL (ref 13.0–17.0)
MCH: 30.3 pg (ref 26.0–34.0)
MCHC: 32.7 g/dL (ref 30.0–36.0)
MCV: 92.7 fL (ref 80.0–100.0)
Platelets: 290 K/uL (ref 150–400)
RBC: 4.65 MIL/uL (ref 4.22–5.81)
RDW: 12.4 % (ref 11.5–15.5)
WBC: 8.5 K/uL (ref 4.0–10.5)
nRBC: 0 % (ref 0.0–0.2)

## 2024-08-14 LAB — TSH: TSH: 2.313 u[IU]/mL (ref 0.350–4.500)

## 2024-08-14 LAB — MAGNESIUM: Magnesium: 2.4 mg/dL (ref 1.7–2.4)

## 2024-08-14 LAB — PHOSPHORUS: Phosphorus: 3.3 mg/dL (ref 2.5–4.6)

## 2024-08-14 MED ORDER — PANTOPRAZOLE SODIUM 40 MG PO TBEC
40.0000 mg | DELAYED_RELEASE_TABLET | Freq: Every day | ORAL | Status: DC
  Administered 2024-08-14: 40 mg via ORAL
  Filled 2024-08-14: qty 1

## 2024-08-14 MED ORDER — COLCHICINE 0.6 MG PO TABS
0.6000 mg | ORAL_TABLET | Freq: Two times a day (BID) | ORAL | 0 refills | Status: DC
  Filled 2024-08-14: qty 56, 28d supply, fill #0

## 2024-08-14 MED ORDER — METOPROLOL SUCCINATE ER 50 MG PO TB24
25.0000 mg | ORAL_TABLET | Freq: Every day | ORAL | Status: DC
  Administered 2024-08-14: 25 mg via ORAL
  Filled 2024-08-14: qty 1

## 2024-08-14 NOTE — Progress Notes (Signed)
 Progress Note  Patient Name: Aaron Avila Date of Encounter: 08/14/2024 [facility] Cardiologist: Newman JINNY Lawrence, MD   Interval Summary    Chest pain is much better today. Labs look good. He is ambulating without issues.  Vital Signs Vitals:   08/13/24 2157 08/13/24 2300 08/14/24 0300 08/14/24 0400  BP: 118/75 (!) 134/93 98/65 118/86  Pulse: 84 85 78 63  Resp: 20 (!) 21 20 15   Temp: 98 F (36.7 C)  97.7 F (36.5 C)   TempSrc: Oral  Oral   SpO2: 95% 96% 95% 93%  Weight:      Height:       No intake or output data in the 24 hours ending 08/14/24 0715    08/13/2024    7:44 AM 08/06/2024    2:47 PM 07/28/2024   11:00 PM  Last 3 Weights  Weight (lbs) 245 lb 249 lb 3.2 oz 251 lb 8.7 oz  Weight (kg) 111.131 kg 113.036 kg 114.1 kg      Telemetry/ECG  NSR 80s - Personally Reviewed  Physical Exam  GEN: No acute distress.   Neck: No JVD Cardiac: RRR, no murmurs, rubs, or gallops.  Respiratory: Clear to auscultation bilaterally. GI: Soft, nontender, non-distended  MS: No edema  Relevant CV Studies:  Limited Echo 08/13/24  1. Left ventricular ejection fraction, by estimation, is 55 to 60%. The  left ventricle has normal function. The left ventricle has no regional  wall motion abnormalities.   2. Right ventricular systolic function is normal. The right ventricular  size is normal.   3. The mitral valve was not well visualized. No evidence of mitral valve  regurgitation. No evidence of mitral stenosis.   4. The aortic valve was not well visualized. Aortic valve regurgitation  is not visualized.    2d echo 07/29/2024  1. Left ventricular ejection fraction, by estimation, is 35 to 40%. The  left ventricle has moderately decreased function. The left ventricle  demonstrates regional wall motion abnormalities (see scoring  diagram/findings for description). Left ventricular   diastolic parameters were normal. LAD and RCA territory wall motion  abnormalities;  not well visualized in all views basal function is  preserved.   2. Right ventricular systolic function is normal. The right ventricular  size is normal. Tricuspid regurgitation signal is inadequate for assessing  PA pressure.   3. The mitral valve was not well visualized. No evidence of mitral valve  regurgitation. No evidence of mitral stenosis.   4. The aortic valve was not well visualized. Aortic valve regurgitation  is not visualized. No aortic stenosis is present.   5. The inferior vena cava is dilated in size with >50% respiratory  variability, suggesting right atrial pressure of 8 mmHg.    LHC 07/28/2024   LV end diastolic pressure is moderately elevated.   The left ventricular ejection fraction is 45-50% by visual estimate.   1.  Acute inferior STEMI secondary to total occlusion of the distal RCA.  Successful PCI of the distal vessel culprit lesion using a 3.0 x 24 mm Synergy DES, treated 100% stenosis and TIMI 0 flow, reduced to 0% stenosis and TIMI-3 flow. 2.  Severe proximal RCA stenosis of 75% treated with a 3.5 x 24 mm Synergy DES, reducing percent stenosis to 0 and TIMI-3 flow post PCI 3.  Widely patent left main with no stenosis 4.  Patent LAD with moderate LAD/first diagonal bifurcation disease appropriate for medical therapy 5.  Patent circumflex and intermediate branches  with no significant stenoses 6.  Severe hypokinesis of the inferior wall with moderately reduced LVEF of 45%, moderately elevated LVEDP 28 mmHg   Recommendations: DAPT with aspirin  and Effient  x 12 months without interruption.  Effient  60 mg loaded orally on the table.  2D echocardiogram for reassessment of LVEF and presence of valvular disease.  Post MI medical therapy.   Diagnostic Dominance: Right  Intervention     Assessment & Plan   Chest pain-suspected Pericarditis CAD with recent inferior STEMI - inferior STEMI 07/26/2024 follow-up with successful PCI/DES of the distal RCA - Patient  presented with chest pain given aspirin , nitro glycerin, and Nitropaste - High-sensitivity troponin negative x 2 - EKG showing diffuse ST changes concerning for pericarditis - Started on colchicine 0.6 mg twice daily and PPI> continue for 3 months -  aspirin  81 mg daily, Effient  10 mg daily - Lipitor 80 mg daily - pain has resolved - can likely d/c today  ICM/HFrEF - Echo 07/28/24 with an EF of 35 to 40%, severe inferior hypokinesis, normal LV function, dilated IVC, no significant valvular abnormalities - PTA Entresto  24-26 mg twice daily, Toprol  25 mg daily, Jardiance  10 mg daily, spironolactone  25 mg daily - Limited echocardiogram showed LVEF 55-60%  HTN - Blood pressure has been soft overnight - Continue current medications  Palpitations NSVT - Continue beta-blocker therapy  Tobacco abuse -Cessation recommended   For questions or updates, please contact [facility] Please consult [email or web address] for contact info under         Signed, Chantrell Apsey VEAR Fishman, PA-C

## 2024-08-14 NOTE — Discharge Summary (Signed)
 [facility] Discharge Summary   Patient: Aaron Avila FMW:[id number]  PCP: Glover Lenis, MD  Date of admission: 08/13/2024   Date of discharge: 08/14/2024     Discharge Diagnoses:  Principal Problem:   Chest pain with high risk of acute coronary syndrome Active Problems:   Nicotine dependence   Familial hypercholesterolemia   Coronary artery disease of native artery of native heart with stable angina pectoris   History of acute inferior wall MI   Admitted From: Home Disposition:  Home   Recommendations for Outpatient Follow-up:  F/u with PCP in 1 wk F/u with Cardio in 1-2 weeks Follow up LABS/TEST:     Follow-up Information     Glover Lenis, MD Follow up in 1 week(s).   Specialty: Family Medicine Contact information: [address] [place] [address] [phone]         Gollan, Timothy J, MD Follow up in 2 week(s).   Specialty: Cardiology Contact information: [address] [place] [address] [phone]                Diet recommendation: Cardiac diet  Activity: The patient is advised to gradually reintroduce usual activities, as tolerated  Discharge Condition: stable  Code Status: Full code   History of present illness: As per the H and P dictated on admission.   Hospital Course:  Aaron Avila is a pleasant 53 y.o. male with medical history significant for CAD s/p DES x2  to prox-mid RCA in 07/2024, STEMI, NSVT in the setting of ischemic ardiomyopathy/HFrEF (ECHO 07/29/2024: Ef 35-40%), HTN who came into ED complaining of chest pain at work started at 6:30 this morning.  Patient stated that   he woke up at 4:30 AM and thought that he had something wrong in his chest.  But he went to work.  At around 6:30 AM this morning while he was walking at his workplace, he started feeling some chest discomfort and slight palpitations.  He chest discomfort was pressure-like 3-4/10 in intensity, similar to his previous heart attack.  He did  take Tylenol  prior to arrival and chest pressure improved slightly.  He denies any fever or chills.  He denies any vomiting but had some nausea.  He denies any cough, leg edema, headache.   ED Course: Upon arrival to the ED, patient is found to be in chest pain, with normal vitals, negative chest x-ray for acute pathology initial EKG was not different.  Patient was given aspirin  324 mg, nitroglycerin  half inches topical.  Patient complained of chest pain again and second EKG showed minimal ST elevation in lead II and aVF.  Cardiologist was contacted by ED and they advised to start patient on heparin  and cardiology evaluation to rule out ACS.  Hospitalist service was consulted for evaluation for admission.    Assessment and Plan: # Chest pain, atypical pain. CAD, Recent inferior STEMI September 2025 with stent placed to proximal and mid RCA  Troponin negative x 2, patient was seen by cardiology, EKG suggestive of pericarditis.  Patient was started on colchicine 0.6 mg p.o. twice daily.  Symptoms resolved.  Currently patient is chest pain-free.  Patient was cleared by cardiology to discharge on colchicine 0.6 mg p.o. twice daily for few weeks and follow-up as an outpatient.  Patient agreed with the discharge planning. Resumed aspirin , prasugrel , statin.   # Ischemic cardiomyopathy.  Stable - Continue Toprol -XL, Entresto , Jardiance  and spironolactone  # HTN: Stable. - Continue metoprolol  25 mg daily # GERD: Resumed  omeprazole   Body mass index is 33.23 kg/m.  Nutrition Interventions:  Patient was ambulatory without any assistance.  On the day of the discharge the patient's vitals were stable, and no other acute medical condition were reported by patient. the patient was felt safe to be discharge at Home.  Consultants: Cardiology Procedures: None  Discharge Exam: General: Appear in no distress, Oral Mucosa Clear, moist. Cardiovascular: S1 and S2 Present, no Murmur, Respiratory: normal  respiratory effort, Bilateral Air entry present and no Crackles, no wheezes Abdomen: Bowel Sound present, Soft and no tenderness. Extremities: no Pedal edema, no calf tenderness Neurology: alert and oriented to time, place, and person affect appropriate.  Filed Weights   08/13/24 0744  Weight: 111.1 kg   Vitals:   08/14/24 0400 08/14/24 0746  BP: 118/86 90/72  Pulse: 63 71  Resp: 15 20  Temp:  98.1 F (36.7 C)  SpO2: 93% 96%    DISCHARGE MEDICATION: Allergies as of 08/14/2024   No Known Allergies      Medication List     TAKE these medications    aspirin  EC 81 MG tablet Take 1 tablet (81 mg total) by mouth daily. Swallow whole.   atorvastatin  80 MG tablet Commonly known as: LIPITOR Take 1 tablet (80 mg total) by mouth daily.   colchicine 0.6 MG tablet Take 1 tablet (0.6 mg total) by mouth 2 (two) times daily for 28 days.   Entresto  24-26 MG Generic drug: sacubitril -valsartan  Take 1 tablet by mouth 2 (two) times daily.   Jardiance  10 MG Tabs tablet Generic drug: empagliflozin  Take 1 tablet (10 mg total) by mouth daily.   metoprolol  succinate 25 MG 24 hr tablet Commonly known as: TOPROL -XL Take 1 tablet (25 mg total) by mouth daily.   nitroGLYCERIN  0.4 MG SL tablet Commonly known as: NITROSTAT  Place 1 tablet (0.4 mg total) under the tongue every 5 (five) minutes x 3 doses as needed for chest pain.   omeprazole 20 MG tablet Commonly known as: PRILOSEC OTC Take 20 mg by mouth daily.   prasugrel  10 MG Tabs tablet Commonly known as: EFFIENT  Take 1 tablet (10 mg total) by mouth daily.   spironolactone  25 MG tablet Commonly known as: ALDACTONE  Take 1 tablet (25 mg total) by mouth daily.   TYLENOL  PO Take 2-3 tablets by mouth 2 (two) times daily as needed (headache, pain).       No Known Allergies Discharge Instructions     Call MD for:  difficulty breathing, headache or visual disturbances   Complete by: As directed    Call MD for:  extreme  fatigue   Complete by: As directed    Call MD for:  persistant dizziness or light-headedness   Complete by: As directed    Call MD for:  persistant nausea and vomiting   Complete by: As directed    Call MD for:  severe uncontrolled pain   Complete by: As directed    Call MD for:  temperature >100.4   Complete by: As directed    Diet - low sodium heart healthy   Complete by: As directed    Discharge instructions   Complete by: As directed    F/u with PCP in 1 wk F/u with Cardio in 1-2 weeks   Increase activity slowly   Complete by: As directed        The results of significant diagnostics from this hospitalization (including imaging, microbiology, ancillary and laboratory) are listed below for reference.  Significant Diagnostic Studies: ECHOCARDIOGRAM LIMITED Result Date: 08/13/2024    ECHOCARDIOGRAM LIMITED REPORT   Patient Name:   Aaron Avila Date of Exam: 08/13/2024 Medical Rec #:  [id number]    Height:       72.0 in Accession #:    [id number]   Weight:       245.0 lb Date of Birth:  Mar 16, 1971   BSA:          2.322 m Patient Age:    52 years     BP:           106/88 mmHg Patient Gender: M            HR:           83 bpm. Exam Location:  [facility] Procedure: 2D Echo and Intracardiac Opacification Agent (Both Spectral and Color            Flow Doppler were utilized during procedure). Indications:     Chest Pain R07.9  History:         Patient has prior history of Echocardiogram examinations, most                  recent 07/29/2024. CAD; Signs/Symptoms:Chest Pain.  Sonographer:     Ashley McNeely-Sloane Referring Phys:  [id number] [name] Diagnosing Phys: Evalene Lunger MD IMPRESSIONS  1. Left ventricular ejection fraction, by estimation, is 55 to 60%. The left ventricle has normal function. The left ventricle has no regional wall motion abnormalities.  2. Right ventricular systolic function is normal. The right ventricular size is normal.  3. The mitral valve was not well visualized. No  evidence of mitral valve regurgitation. No evidence of mitral stenosis.  4. The aortic valve was not well visualized. Aortic valve regurgitation is not visualized. FINDINGS  Left Ventricle: Left ventricular ejection fraction, by estimation, is 55 to 60%. The left ventricle has normal function. The left ventricle has no regional wall motion abnormalities. Definity  contrast agent was given IV to delineate the left ventricular  endocardial borders. The left ventricular internal cavity size was normal in size. There is no left ventricular hypertrophy. Right Ventricle: The right ventricular size is normal. No increase in right ventricular wall thickness. Right ventricular systolic function is normal. Left Atrium: Left atrial size was normal in size. Right Atrium: Right atrial size was normal in size. Pericardium: There is no evidence of pericardial effusion. Mitral Valve: The mitral valve was not well visualized. No evidence of mitral valve stenosis. Tricuspid Valve: The tricuspid valve is not well visualized. Tricuspid valve regurgitation is not demonstrated. No evidence of tricuspid stenosis. Aortic Valve: The aortic valve was not well visualized. Aortic valve regurgitation is not visualized. Pulmonic Valve: The pulmonic valve was normal in structure. Pulmonic valve regurgitation is not visualized. No evidence of pulmonic stenosis. Aorta: The aortic root is normal in size and structure. IAS/Shunts: No atrial level shunt detected by color flow Doppler.  LV Volumes (MOD) LV vol d, MOD A4C: 95.7 ml LV vol s, MOD A4C: 37.8 ml LV SV MOD A4C:     95.7 ml Evalene Lunger MD Electronically signed by Evalene Lunger MD Signature Date/Time: 08/13/2024/6:09:57 PM    Final    DG Chest Portable 1 View Result Date: 08/13/2024 CLINICAL DATA:  Chest pain. EXAM: PORTABLE CHEST 1 VIEW COMPARISON:  07/28/2024 and CT chest 08/30/2022. FINDINGS: Trachea is midline. Heart size stable. Lungs are low in volume but clear. No pleural fluid.  Postoperative changes in the cervical  spine. IMPRESSION: No acute findings. Electronically Signed   By: Newell Eke M.D.   On: 08/13/2024 08:32   ECHOCARDIOGRAM COMPLETE Result Date: 07/29/2024    ECHOCARDIOGRAM REPORT   Patient Name:   Aaron Avila Date of Exam: 07/29/2024 Medical Rec #:  [id number]    Height:       72.0 in Accession #:    [id number]   Weight:       251.5 lb Date of Birth:  04/21/1971   BSA:          2.349 m Patient Age:    52 years     BP:           134/78 mmHg Patient Gender: M            HR:           83 bpm. Exam Location:  Inpatient Procedure: 2D Echo, Color Doppler, Cardiac Doppler and Intracardiac            Opacification Agent (Both Spectral and Color Flow Doppler were            utilized during procedure). Indications:    Acute Ischemic Heart Disease, unspecified I24.9  History:        Patient has no prior history of Echocardiogram examinations.  Sonographer:    Tinnie Gosling RDCS Referring Phys: [phone] MICHAEL COOPER  Sonographer Comments: Technically difficult study due to poor echo windows. Image acquisition challenging due to patient body habitus and Image acquisition challenging due to respiratory motion. IMPRESSIONS  1. Left ventricular ejection fraction, by estimation, is 35 to 40%. The left ventricle has moderately decreased function. The left ventricle demonstrates regional wall motion abnormalities (see scoring diagram/findings for description). Left ventricular  diastolic parameters were normal. LAD and RCA territory wall motion abnormalities; not well visualized in all views basal function is preserved.  2. Right ventricular systolic function is normal. The right ventricular size is normal. Tricuspid regurgitation signal is inadequate for assessing PA pressure.  3. The mitral valve was not well visualized. No evidence of mitral valve regurgitation. No evidence of mitral stenosis.  4. The aortic valve was not well visualized. Aortic valve regurgitation is not visualized. No  aortic stenosis is present.  5. The inferior vena cava is dilated in size with >50% respiratory variability, suggesting right atrial pressure of 8 mmHg. Comparison(s): No prior Echocardiogram. FINDINGS  Left Ventricle: Left ventricular ejection fraction, by estimation, is 35 to 40%. The left ventricle has moderately decreased function. The left ventricle demonstrates regional wall motion abnormalities. The left ventricular internal cavity size was normal in size. There is no left ventricular hypertrophy. Left ventricular diastolic parameters were normal.  LV Wall Scoring: The mid and distal anterior wall, entire inferior wall, mid anteroseptal segment, and mid inferolateral segment are hypokinetic. The basal anteroseptal segment, basal inferolateral segment, and basal anterior segment are normal. LAD and RCA territory wall motion abnormalities; not well visualized in all views basal function is preserved. Right Ventricle: The right ventricular size is normal. No increase in right ventricular wall thickness. Right ventricular systolic function is normal. Tricuspid regurgitation signal is inadequate for assessing PA pressure. Left Atrium: Left atrial size was normal in size. Right Atrium: Right atrial size was normal in size. Pericardium: Trivial pericardial effusion is present. Mitral Valve: The mitral valve was not well visualized. No evidence of mitral valve regurgitation. No evidence of mitral valve stenosis. Tricuspid Valve: The tricuspid valve is normal in structure. Tricuspid valve regurgitation is not  demonstrated. No evidence of tricuspid stenosis. Aortic Valve: The aortic valve was not well visualized. Aortic valve regurgitation is not visualized. No aortic stenosis is present. Pulmonic Valve: The pulmonic valve was not well visualized. Pulmonic valve regurgitation is not visualized. No evidence of pulmonic stenosis. Aorta: The aortic root is normal in size and structure. Venous: The inferior vena cava is  dilated in size with greater than 50% respiratory variability, suggesting right atrial pressure of 8 mmHg. IAS/Shunts: No atrial level shunt detected by color flow Doppler.  LEFT VENTRICLE PLAX 2D LVIDd:         5.30 cm   Diastology LVIDs:         4.60 cm   LV e' medial:    7.83 cm/s LV PW:         1.20 cm   LV E/e' medial:  8.6 LV IVS:        1.10 cm   LV e' lateral:   8.38 cm/s LVOT diam:     2.50 cm   LV E/e' lateral: 8.0 LV SV:         64 LV SV Index:   27 LVOT Area:     4.91 cm  RIGHT VENTRICLE             IVC RV S prime:     13.40 cm/s  IVC diam: 2.10 cm TAPSE (M-mode): 1.8 cm LEFT ATRIUM           Index       RIGHT ATRIUM           Index LA diam:      3.80 cm 1.62 cm/m  RA Area:     15.10 cm LA Vol (A4C): 20.8 ml 8.86 ml/m  RA Volume:   36.90 ml  15.71 ml/m  AORTIC VALVE LVOT Vmax:   73.90 cm/s LVOT Vmean:  48.700 cm/s LVOT VTI:    0.131 m  AORTA Ao Root diam: 3.20 cm MITRAL VALVE MV Area (PHT): 5.58 cm    SHUNTS MV Decel Time: 136 msec    Systemic VTI:  0.13 m MV E velocity: 67.40 cm/s  Systemic Diam: 2.50 cm MV A velocity: 91.70 cm/s MV E/A ratio:  0.74 Stanly Leavens MD Electronically signed by Stanly Leavens MD Signature Date/Time: 07/29/2024/10:37:35 AM    Final    CARDIAC CATHETERIZATION Result Date: 07/28/2024   LV end diastolic pressure is moderately elevated.   The left ventricular ejection fraction is 45-50% by visual estimate. 1.  Acute inferior STEMI secondary to total occlusion of the distal RCA.  Successful PCI of the distal vessel culprit lesion using a 3.0 x 24 mm Synergy DES, treated 100% stenosis and TIMI 0 flow, reduced to 0% stenosis and TIMI-3 flow. 2.  Severe proximal RCA stenosis of 75% treated with a 3.5 x 24 mm Synergy DES, reducing percent stenosis to 0 and TIMI-3 flow post PCI 3.  Widely patent left main with no stenosis 4.  Patent LAD with moderate LAD/first diagonal bifurcation disease appropriate for medical therapy 5.  Patent circumflex and intermediate  branches with no significant stenoses 6.  Severe hypokinesis of the inferior wall with moderately reduced LVEF of 45%, moderately elevated LVEDP 28 mmHg Recommendations: DAPT with aspirin  and Effient  x 12 months without interruption.  Effient  60 mg loaded orally on the table.  2D echocardiogram for reassessment of LVEF and presence of valvular disease.  Post MI medical therapy.   DG Chest 2 View Result Date: 07/28/2024 CLINICAL  DATA:  Chest pain EXAM: CHEST - 2 VIEW COMPARISON:  CT chest 08/30/2022 FINDINGS: Emphysematous changes in the lungs. Lungs appear clear and expanded. No focal consolidation or airspace disease. Heart size and pulmonary vascularity are normal. Mediastinal contours appear intact. Postoperative changes in the cervical spine. No pleural effusion or pneumothorax. IMPRESSION: Emphysematous changes in the lungs. No evidence of active pulmonary disease. Electronically Signed   By: Elsie Gravely M.D.   On: 07/28/2024 21:01    Microbiology: Recent Results (from the past 240 hours)  Resp panel by RT-PCR (RSV, Flu A&B, Covid) Anterior Nasal Swab     Status: None   Collection Time: 08/13/24  8:02 AM   Specimen: Anterior Nasal Swab  Result Value Ref Range Status   SARS Coronavirus 2 by RT PCR NEGATIVE NEGATIVE Final    Comment: (NOTE) SARS-CoV-2 target nucleic acids are NOT DETECTED.  The SARS-CoV-2 RNA is generally detectable in upper respiratory specimens during the acute phase of infection. The lowest concentration of SARS-CoV-2 viral copies this assay can detect is 138 copies/mL. A negative result does not preclude SARS-Cov-2 infection and should not be used as the sole basis for treatment or other patient management decisions. A negative result may occur with  improper specimen collection/handling, submission of specimen other than nasopharyngeal swab, presence of viral mutation(s) within the areas targeted by this assay, and inadequate number of viral copies(<138  copies/mL). A negative result must be combined with clinical observations, patient history, and epidemiological information. The expected result is Negative.  Fact Sheet for Patients:  BloggerCourse.com  Fact Sheet for Healthcare Providers:  SeriousBroker.it  This test is no t yet approved or cleared by the [place]  FDA and  has been authorized for detection and/or diagnosis of SARS-CoV-2 by FDA under an Emergency Use Authorization (EUA). This EUA will remain  in effect (meaning this test can be used) for the duration of the COVID-19 declaration under Section 564(b)(1) of the Act, 21 U.S.C.section 360bbb-3(b)(1), unless the authorization is terminated  or revoked sooner.       Influenza A by PCR NEGATIVE NEGATIVE Final   Influenza B by PCR NEGATIVE NEGATIVE Final    Comment: (NOTE) The Xpert Xpress SARS-CoV-2/FLU/RSV plus assay is intended as an aid in the diagnosis of influenza from Nasopharyngeal swab specimens and should not be used as a sole basis for treatment. Nasal washings and aspirates are unacceptable for Xpert Xpress SARS-CoV-2/FLU/RSV testing.  Fact Sheet for Patients: BloggerCourse.com  Fact Sheet for Healthcare Providers: SeriousBroker.it  This test is not yet approved or cleared by the [place]  FDA and has been authorized for detection and/or diagnosis of SARS-CoV-2 by FDA under an Emergency Use Authorization (EUA). This EUA will remain in effect (meaning this test can be used) for the duration of the COVID-19 declaration under Section 564(b)(1) of the Act, 21 U.S.C. section 360bbb-3(b)(1), unless the authorization is terminated or revoked.     Resp Syncytial Virus by PCR NEGATIVE NEGATIVE Final    Comment: (NOTE) Fact Sheet for Patients: BloggerCourse.com  Fact Sheet for Healthcare  Providers: SeriousBroker.it  This test is not yet approved or cleared by the [place]  FDA and has been authorized for detection and/or diagnosis of SARS-CoV-2 by FDA under an Emergency Use Authorization (EUA). This EUA will remain in effect (meaning this test can be used) for the duration of the COVID-19 declaration under Section 564(b)(1) of the Act, 21 U.S.C. section 360bbb-3(b)(1), unless the authorization is terminated or revoked.  Performed at [facility], [address] [place], [place] [address]      Labs: CBC: Recent Labs  Lab 08/13/24 0802 08/14/24 0858  WBC 9.3 8.5  HGB 13.9 14.1  HCT 41.9 43.1  MCV 92.1 92.7  PLT 324 290   Basic Metabolic Panel: Recent Labs  Lab 08/13/24 0802 08/14/24 0858  NA 137 137  K 4.7 4.4  CL 105 104  CO2 23 24  GLUCOSE 102* 106*  BUN 21* 17  CREATININE 0.82 0.77  CALCIUM  9.1 8.7*  MG  --  2.4  PHOS  --  3.3   Liver Function Tests: No results for input(s): AST, ALT, ALKPHOS, BILITOT, PROT, ALBUMIN in the last 168 hours. No results for input(s): LIPASE, AMYLASE in the last 168 hours. No results for input(s): AMMONIA in the last 168 hours. Cardiac Enzymes: No results for input(s): CKTOTAL, CKMB, CKMBINDEX, TROPONINI in the last 168 hours. BNP (last 3 results) No results for input(s): BNP in the last 8760 hours. CBG: No results for input(s): GLUCAP in the last 168 hours.  Time spent: 35 minutes  Signed:  Elvan Sor  [facility] 08/14/2024 10:32 AM

## 2024-08-14 NOTE — ED Notes (Signed)
 Pt anxious to leave and refused final vital signs prior to discharge

## 2024-08-19 ENCOUNTER — Telehealth: Payer: Self-pay | Admitting: Cardiology

## 2024-08-19 ENCOUNTER — Encounter: Payer: Self-pay | Admitting: Cardiology

## 2024-08-19 NOTE — Telephone Encounter (Signed)
 Duplicate

## 2024-08-19 NOTE — Telephone Encounter (Signed)
 We received an FMLA form from the patient on 08/12/2024.  I have tried several times to call the patient but his voice mail is full.  He needs to sign the Release of Information and pay the $29 form fee.  I am going to mail the form to the patient with a note explaining how we take care of forms in our office.

## 2024-08-19 NOTE — Telephone Encounter (Signed)
 Patient contacted Dr. Gilda nurse asking about the [employer] form.  I placed the form in Dr. Gilda box.

## 2024-08-20 NOTE — Telephone Encounter (Signed)
 Will review Thursday or Friday.  Thanks MJP

## 2024-08-21 ENCOUNTER — Encounter: Payer: Self-pay | Admitting: Cardiology

## 2024-08-21 DIAGNOSIS — Z0279 Encounter for issue of other medical certificate: Secondary | ICD-10-CM

## 2024-08-21 LAB — LIPID PANEL

## 2024-08-22 ENCOUNTER — Ambulatory Visit: Payer: Self-pay | Admitting: Cardiology

## 2024-08-22 LAB — BASIC METABOLIC PANEL WITH GFR
BUN/Creatinine Ratio: 18 (ref 9–20)
BUN: 14 mg/dL (ref 6–24)
CO2: 21 mmol/L (ref 20–29)
Calcium: 9.5 mg/dL (ref 8.7–10.2)
Chloride: 103 mmol/L (ref 96–106)
Creatinine, Ser: 0.8 mg/dL (ref 0.76–1.27)
Glucose: 104 mg/dL — ABNORMAL HIGH (ref 70–99)
Potassium: 4.8 mmol/L (ref 3.5–5.2)
Sodium: 140 mmol/L (ref 134–144)
eGFR: 105 mL/min/1.73 (ref >59)

## 2024-08-22 LAB — LIPID PANEL
Chol/HDL Ratio: 3.7 ratio (ref 0.0–5.0)
Cholesterol, Total: 153 mg/dL (ref 100–199)
HDL: 41 mg/dL (ref >39)
LDL Chol Calc (NIH): 95 mg/dL (ref 0–99)
Triglycerides: 87 mg/dL (ref 0–149)
VLDL Cholesterol Cal: 17 mg/dL (ref 5–40)

## 2024-08-22 NOTE — Progress Notes (Signed)
 LDL remains 95. If compliant with statin, may need PCSK9i.  Thanks MJP

## 2024-08-22 NOTE — Telephone Encounter (Signed)
 FMLA form faxed to [employer] and scanned into chart.  A copy was sent to patient via his e-mail.  Billing notified.

## 2024-08-22 NOTE — Telephone Encounter (Signed)
 Spoke to patient and confirmed he is compliant with statin. Most recent LDL 95 which is above goal (<55) Will proceed with PA for PCSK9i Informed patient I will contact him upon PA approval

## 2024-08-25 ENCOUNTER — Telehealth: Payer: Self-pay | Admitting: Pharmacy Technician

## 2024-08-25 ENCOUNTER — Other Ambulatory Visit (HOSPITAL_COMMUNITY): Payer: Self-pay

## 2024-08-25 DIAGNOSIS — E78019 Familial hypercholesterolemia, unspecified: Secondary | ICD-10-CM

## 2024-08-25 NOTE — Telephone Encounter (Signed)
 Ran test claim for REPATHA. For a 28 day supply and the co-pay is 0.00 . PA is not needed at this time. Nothing saying this is a transition fill.   This test claim was processed through [facility]- copay amounts may vary at other pharmacies due to pharmacy/plan contracts, or as the patient moves through the different stages of their insurance plan.

## 2024-08-27 MED ORDER — REPATHA SURECLICK 140 MG/ML ~~LOC~~ SOAJ: 140.0000 mg | SUBCUTANEOUS | 2 refills | Status: DC

## 2024-08-27 NOTE — Telephone Encounter (Signed)
 Called patient and informed him of Repatha copay and confirmed preferred pharmacy. Rx for Repatha sent and f/u lipid panel ordered. Instructed patient to complete fasting lipid panel ~ 3 months after starting Repatha.

## 2024-08-27 NOTE — Addendum Note (Signed)
 Addended by: Demauri Advincula E on: 08/27/2024 04:11 PM   Modules accepted: Orders

## 2024-08-28 ENCOUNTER — Other Ambulatory Visit (HOSPITAL_COMMUNITY): Payer: Self-pay

## 2024-08-29 ENCOUNTER — Other Ambulatory Visit (HOSPITAL_COMMUNITY): Payer: Self-pay

## 2024-09-04 ENCOUNTER — Encounter: Payer: Self-pay | Admitting: Pharmacist

## 2024-09-04 DIAGNOSIS — E78019 Familial hypercholesterolemia, unspecified: Secondary | ICD-10-CM

## 2024-09-07 NOTE — Progress Notes (Unsigned)
 Cardiology Clinic Note   Patient Name: Aaron Avila Date of Encounter: 09/09/2024  Primary Care Provider:  Glover Lenis, MD Primary Cardiologist:  Newman JINNY Lawrence, MD  Patient Profile    Aaron Avila 53 year old male presents to the clinic today for follow-up evaluation of his chest discomfort.  Past Medical History    Past Medical History:  Diagnosis Date   Myocardial infarction [facility])    Past Surgical History:  Procedure Laterality Date   CERVICAL SPINE SURGERY     COLONOSCOPY WITH PROPOFOL  N/A 03/09/2023   Procedure: COLONOSCOPY WITH PROPOFOL ;  Surgeon: Onita Elspeth Sharper, DO;  Location: [facility] ENDOSCOPY;  Service: Gastroenterology;  Laterality: N/A;   CORONARY/GRAFT ACUTE MI REVASCULARIZATION N/A 07/28/2024   Procedure: Coronary/Graft Acute MI Revascularization;  Surgeon: Wonda Sharper, MD;  Location: [facility] INVASIVE CV LAB;  Service: Cardiovascular;  Laterality: N/A;   LEFT HEART CATH AND CORONARY ANGIOGRAPHY N/A 07/28/2024   Procedure: LEFT HEART CATH AND CORONARY ANGIOGRAPHY;  Surgeon: Wonda Sharper, MD;  Location: [facility] INVASIVE CV LAB;  Service: Cardiovascular;  Laterality: N/A;    Allergies  No Known Allergies  History of Present Illness    Aaron Avila has a PMH of coronary artery disease status post PCI with DES x 2 to his proximal-mid RCA 9/25, NSVT in the setting of ischemic cardiomyopathy/HFrEF.  His PMH also includes hypertension and hyperlipidemia.  He followed up in the clinic on 08/06/2024.  During that time he noted significant improvement in his fatigue and shortness of breath.  However, he did note that it was still present.  He denied palpitations, dizziness, and syncope.  He had reduced his tobacco use from 20-20 5-5-6 cigarettes/day.  He had started on heart healthy diet.  He was able to walk around the grocery store without significant shortness of breath.  He reported compliance with his medications.  He asked about returning to work.  He works at  [employer].  He reported that he was mainly standing on a machine and occasionally needed to pick up heavy items.  He had been referred to the lipid clinic.  He was continued on aspirin , Effient , and Lipitor.  Plan was made to repeat fasting lipids after about 3 months.  He was provided with a letter to return to work on 08/11/2024.  He was deemed stable to start cardiac rehab.  His spironolactone  was increased from 12.5 mg daily to 25 mg.  Plan to repeat echocardiogram around January was mention.  Follow-up was planned for 1 month.  He presented to the emergency department after developing chest discomfort at work.  His cardiac enzymes were negative x 2.  He was noted to have some atypical and typical features.  EKG suggested pericarditis.  Colchicine was added to his medication regimen.  Repeat echocardiogram did not show pericardial effusion.  EF was noted to be 55 to 60% plan was made to continue colchicine for 28 days.  He was discharged in stable condition on 08/15/2024.  He presents to the clinic today for follow-up evaluation and states he does not have any chest pain today.  He does report that he has had a few episodes of dull chest discomfort intermittently since his emergency department visit.  These are with activity.  They dissipate within minutes.  We reviewed his cardiac catheterization and recent emergency department visit.  He reports that he will be starting his Repatha today.  He reports compliance with his other medications.  He has been tolerating colchicine  well.  We also reviewed his 2 prior echocardiograms.  I will order a CRP and sedimentation rate today.  He continues to smoke half pack per day.  He requests nicotine patches.  I will give him a work note and NicoDerm patches.  We will plan follow-up in 3 to 4 months.  Today he denies chest pain, shortness of breath, lower extremity edema, fatigue, palpitations, melena, hematuria, hemoptysis, diaphoresis, weakness, presyncope,  syncope, orthopnea, and PND.   Home Medications    Prior to Admission medications   Medication Sig Start Date End Date Taking? Authorizing Provider  Acetaminophen  (TYLENOL  PO) Take 2-3 tablets by mouth 2 (two) times daily as needed (headache, pain).    [provider]  aspirin  EC 81 MG tablet Take 1 tablet (81 mg total) by mouth daily. Swallow whole. 07/30/24 07/30/25  Patwardhan, Newman PARAS, MD  atorvastatin  (LIPITOR) 80 MG tablet Take 1 tablet (80 mg total) by mouth daily. 07/30/24   Patwardhan, Newman PARAS, MD  colchicine 0.6 MG tablet Take 1 tablet (0.6 mg total) by mouth 2 (two) times daily for 28 days. 08/14/24 09/11/24  Von Bellis, MD  empagliflozin  (JARDIANCE ) 10 MG TABS tablet Take 1 tablet (10 mg total) by mouth daily. 07/30/24   Patwardhan, Manish J, MD  Evolocumab (REPATHA SURECLICK) 140 MG/ML SOAJ Inject 140 mg into the skin every 14 (fourteen) days. 08/27/24   Patwardhan, Newman PARAS, MD  metoprolol  succinate (TOPROL -XL) 25 MG 24 hr tablet Take 1 tablet (25 mg total) by mouth daily. 07/30/24   Patwardhan, Newman PARAS, MD  nitroGLYCERIN  (NITROSTAT ) 0.4 MG SL tablet Place 1 tablet (0.4 mg total) under the tongue every 5 (five) minutes x 3 doses as needed for chest pain. 07/30/24   Patwardhan, Newman PARAS, MD  omeprazole (PRILOSEC OTC) 20 MG tablet Take 20 mg by mouth daily.    [provider]  prasugrel  (EFFIENT ) 10 MG TABS tablet Take 1 tablet (10 mg total) by mouth daily. 07/30/24   Patwardhan, Newman PARAS, MD  sacubitril -valsartan  (ENTRESTO ) 24-26 MG Take 1 tablet by mouth 2 (two) times daily. 07/30/24   Patwardhan, Newman PARAS, MD  spironolactone  (ALDACTONE ) 25 MG tablet Take 1 tablet (25 mg total) by mouth daily. 08/06/24   Sheron Lorette GRADE, PA-C    Family History    History reviewed. No pertinent family history. has no family status information on file.   Social History    Social History   Socioeconomic History   Marital status: Single    Spouse name: Not on file   Number  of children: Not on file   Years of education: Not on file   Highest education level: Not on file  Occupational History   Not on file  Tobacco Use   Smoking status: Every Day    Current packs/day: 1.00    Types: Cigarettes   Smokeless tobacco: Never  Vaping Use   Vaping status: Never Used  Substance and Sexual Activity   Alcohol use: Yes    Comment: occ   Drug use: Yes    Types: Marijuana    Comment: 2 MONTHS AGO   Sexual activity: Not on file  Other Topics Concern   Not on file  Social History Narrative   Not on file   Social Drivers of Health   Financial Resource Strain: Low Risk  (12/18/2023)   Received from [facility]   Overall Financial Resource Strain (CARDIA)    Difficulty of Paying Living Expenses: Not very hard  Food Insecurity: No Food Insecurity (07/28/2024)   Hunger Vital Sign    Worried About Running Out of Food in the Last Year: Never true    Ran Out of Food in the Last Year: Never true  Transportation Needs: No Transportation Needs (07/28/2024)   PRAPARE - Administrator, Civil Service (Medical): No    Lack of Transportation (Non-Medical): No  Physical Activity: Not on file  Stress: Not on file  Social Connections: Not on file  Intimate Partner Violence: Not At Risk (07/28/2024)   Humiliation, Afraid, Rape, and Kick questionnaire    Fear of Current or Ex-Partner: No    Emotionally Abused: No    Physically Abused: No    Sexually Abused: No     Review of Systems    General:  No chills, fever, night sweats or weight changes.  Cardiovascular:  No chest pain, dyspnea on exertion, edema, orthopnea, palpitations, paroxysmal nocturnal dyspnea. Dermatological: No rash, lesions/masses Respiratory: No cough, dyspnea Urologic: No hematuria, dysuria Abdominal:   No nausea, vomiting, diarrhea, bright red blood per rectum, melena, or hematemesis Neurologic:  No visual changes, wkns, changes in mental status. All other systems  reviewed and are otherwise negative except as noted above.  Physical Exam    VS:  BP 100/68   Pulse 91   Ht 6' (1.829 m)   Wt 242 lb (109.8 kg)   SpO2 95%   BMI 32.82 kg/m  , BMI Body mass index is 32.82 kg/m. GEN: Well nourished, well developed, in no acute distress. HEENT: normal. Neck: Supple, no JVD, carotid bruits, or masses. Cardiac: RRR, no murmurs, rubs, or gallops. No clubbing, cyanosis, edema.  Radials/DP/PT 2+ and equal bilaterally.  Respiratory:  Respirations regular and unlabored, clear to auscultation bilaterally. GI: Soft, nontender, nondistended, BS + x 4. MS: no deformity or atrophy. Skin: warm and dry, no rash. Neuro:  Strength and sensation are intact. Psych: Normal affect.  Accessory Clinical Findings    Recent Labs: 07/28/2024: ALT 22 08/14/2024: Hemoglobin 14.1; Magnesium 2.4; Platelets 290; TSH 2.313 08/21/2024: BUN 14; Creatinine, Ser 0.80; Potassium 4.8; Sodium 140   Recent Lipid Panel    Component Value Date/Time   CHOL 153 08/21/2024 0843   TRIG 87 08/21/2024 0843   HDL 41 08/21/2024 0843   CHOLHDL 3.7 08/21/2024 0843   CHOLHDL 6.4 07/28/2024 2039   VLDL 36 07/28/2024 2039   LDLCALC 95 08/21/2024 0843         ECG personally reviewed by me today- None today.    Limited Echo 08/13/24  1. Left ventricular ejection fraction, by estimation, is 55 to 60%. The  left ventricle has normal function. The left ventricle has no regional  wall motion abnormalities.   2. Right ventricular systolic function is normal. The right ventricular  size is normal.   3. The mitral valve was not well visualized. No evidence of mitral valve  regurgitation. No evidence of mitral stenosis.   4. The aortic valve was not well visualized. Aortic valve regurgitation  is not visualized.    2d echo 07/29/2024  1. Left ventricular ejection fraction, by estimation, is 35 to 40%. The  left ventricle has moderately decreased function. The left ventricle  demonstrates  regional wall motion abnormalities (see scoring  diagram/findings for description). Left ventricular   diastolic parameters were normal. LAD and RCA territory wall motion  abnormalities; not well visualized in all views basal function is  preserved.   2. Right ventricular systolic function is  normal. The right ventricular  size is normal. Tricuspid regurgitation signal is inadequate for assessing  PA pressure.   3. The mitral valve was not well visualized. No evidence of mitral valve  regurgitation. No evidence of mitral stenosis.   4. The aortic valve was not well visualized. Aortic valve regurgitation  is not visualized. No aortic stenosis is present.   5. The inferior vena cava is dilated in size with >50% respiratory  variability, suggesting right atrial pressure of 8 mmHg.    LHC 07/28/2024   LV end diastolic pressure is moderately elevated.   The left ventricular ejection fraction is 45-50% by visual estimate.   1.  Acute inferior STEMI secondary to total occlusion of the distal RCA.  Successful PCI of the distal vessel culprit lesion using a 3.0 x 24 mm Synergy DES, treated 100% stenosis and TIMI 0 flow, reduced to 0% stenosis and TIMI-3 flow. 2.  Severe proximal RCA stenosis of 75% treated with a 3.5 x 24 mm Synergy DES, reducing percent stenosis to 0 and TIMI-3 flow post PCI 3.  Widely patent left main with no stenosis 4.  Patent LAD with moderate LAD/first diagonal bifurcation disease appropriate for medical therapy 5.  Patent circumflex and intermediate branches with no significant stenoses 6.  Severe hypokinesis of the inferior wall with moderately reduced LVEF of 45%, moderately elevated LVEDP 28 mmHg   Recommendations: DAPT with aspirin  and Effient  x 12 months without interruption.  Effient  60 mg loaded orally on the table.  2D echocardiogram for reassessment of LVEF and presence of valvular disease.  Post MI medical therapy.   Diagnostic Dominance: Right  Intervention            Assessment & Plan   1.  Chest pain-no chest pain today.  Presented to the emergency department on 08/13/2024 and was discharged on 08/15/2024.  He noted chest discomfort while walking to work and presented for evaluation.  EKG showed possible pericarditis in inferior leads.  Limited echocardiogram showed no pleural effusion and normal EF. Order sedimentation rate and CRP Continue cardiac rehab  Cardiomyopathy-no increased DOE or activity intolerance.  Repeat echocardiogram showed EF of 55-60%. Heart healthy low-sodium diet Continue to gradually increase physical activity Continue metoprolol , Jardiance , Entresto , spironolactone   Coronary artery disease-no chest pain today.  He has status post PCI with DES x 2 (07/28/2024) to his proximal-mid and distal RCA. Continue aspirin , Lipitor, Effient , metoprolol   Essential hypertension-BP today 100/68. Maintain blood pressure log Continue Entresto , spironolactone , metoprolol   Hyperlipidemia-LDL 95 on 08/21/24. High-fiber diet Continue aspirin , atorvastatin , Repatha Following with pharmacy lipid clinic  Tobacco use-smoking 1/2 pack per day. Smoking cessation strongly recommended Smoking cessation information provided Order NicoDerm patches, 21 mg and 14 mg-future dosing will need to come from PCP  Disposition: Follow-up with Dr. Elmira or me in 3-4 months.  Josefa HERO. Sarae Nicholes NP-C     09/09/2024, 8:23 AM [facility] [address] [place], [place] [address] Office [phone]    Notice: This dictation was prepared with Dragon dictation along with smaller phrase technology. Any transcriptional errors that result from this process are unintentional and may not be corrected upon review.   I spent 14 minutes examining this patient, reviewing medications, and using patient centered shared decision making involving their cardiac care.   I spent  20 minutes reviewing past medical  history,  medications, and prior cardiac tests.

## 2024-09-09 ENCOUNTER — Ambulatory Visit: Attending: General Practice | Admitting: General Practice

## 2024-09-09 ENCOUNTER — Encounter: Payer: Self-pay | Admitting: General Practice

## 2024-09-09 VITALS — BP 100/68 | HR 91 | Ht 72.0 in | Wt 242.0 lb

## 2024-09-09 DIAGNOSIS — I2510 Atherosclerotic heart disease of native coronary artery without angina pectoris: Secondary | ICD-10-CM | POA: Diagnosis not present

## 2024-09-09 DIAGNOSIS — I10 Essential (primary) hypertension: Secondary | ICD-10-CM | POA: Diagnosis not present

## 2024-09-09 DIAGNOSIS — Z720 Tobacco use: Secondary | ICD-10-CM

## 2024-09-09 DIAGNOSIS — R0789 Other chest pain: Secondary | ICD-10-CM | POA: Diagnosis not present

## 2024-09-09 DIAGNOSIS — E7800 Pure hypercholesterolemia, unspecified: Secondary | ICD-10-CM

## 2024-09-09 DIAGNOSIS — I255 Ischemic cardiomyopathy: Secondary | ICD-10-CM | POA: Diagnosis not present

## 2024-09-09 MED ORDER — NICOTINE 21 MG/24HR TD PT24
21.0000 mg | MEDICATED_PATCH | Freq: Every day | TRANSDERMAL | 0 refills | Status: AC
Start: 2024-09-09 — End: ?

## 2024-09-09 MED ORDER — NICOTINE 14 MG/24HR TD PT24: 14.0000 mg | MEDICATED_PATCH | Freq: Every day | TRANSDERMAL | 0 refills | Status: AC

## 2024-09-09 NOTE — Patient Instructions (Addendum)
 Medication Instructions:  START 21MG  NICODERM PATCH WEAR ONE PATCH DAILY FOR 28 DAYS THEN START THE 14MG  PATCH FOR 28 DAYS. ADDITIONAL REFILLS WILL NEED TO COME FROM YOUR PCP. *If you need a refill on your cardiac medications before your next appointment, please call your pharmacy*  Lab Work: TODAY-SED RATE & CRP If you have labs (blood work) drawn today and your tests are completely normal, you will receive your results only by: MyChart Message (if you have MyChart) OR A paper copy in the mail If you have any lab test that is abnormal or we need to change your treatment, we will call you to review the results.  Testing/Procedures: NONE ORDERED  Follow-Up: At [facility], you and your health needs are our priority.  As part of our continuing mission to provide you with exceptional heart care, our providers are all part of one team.  This team includes your primary Cardiologist (physician) and Advanced Practice Providers or APPs (Physician Assistants and Nurse Practitioners) who all work together to provide you with the care you need, when you need it.  Your next appointment:   3-4 month(s)  Provider:   Newman JINNY Lawrence, MD or Josefa Beauvais, NP   We recommend signing up for the patient portal called MyChart.  Sign up information is provided on this After Visit Summary.  MyChart is used to connect with patients for Virtual Visits (Telemedicine).  Patients are able to view lab/test results, encounter notes, upcoming appointments, etc.  Non-urgent messages can be sent to your provider as well.   To learn more about what you can do with MyChart, go to [email or web address].   Other Instructions

## 2024-09-10 ENCOUNTER — Ambulatory Visit: Payer: Self-pay | Admitting: General Practice

## 2024-09-10 LAB — C-REACTIVE PROTEIN: CRP: <1 mg/L (ref 0–10)

## 2024-09-10 LAB — SEDIMENTATION RATE: Sed Rate: 25 mm/h (ref 0–30)

## 2024-09-16 ENCOUNTER — Other Ambulatory Visit (HOSPITAL_COMMUNITY): Payer: Self-pay

## 2024-09-17 ENCOUNTER — Other Ambulatory Visit (HOSPITAL_COMMUNITY): Payer: Self-pay

## 2024-12-02 ENCOUNTER — Telehealth: Payer: Self-pay

## 2024-12-02 NOTE — Telephone Encounter (Signed)
 Called patient to remind him to obtain updated lipid panel after starting Repatha . NR. VM full unable to LVM.

## 2024-12-09 ENCOUNTER — Encounter: Payer: Self-pay | Admitting: Cardiology

## 2024-12-09 ENCOUNTER — Ambulatory Visit: Admitting: Cardiology

## 2024-12-09 VITALS — BP 120/76 | HR 95 | Ht 72.0 in | Wt 234.0 lb

## 2024-12-09 DIAGNOSIS — R0789 Other chest pain: Secondary | ICD-10-CM | POA: Insufficient documentation

## 2024-12-09 DIAGNOSIS — I5020 Unspecified systolic (congestive) heart failure: Secondary | ICD-10-CM | POA: Diagnosis not present

## 2024-12-09 DIAGNOSIS — I25118 Atherosclerotic heart disease of native coronary artery with other forms of angina pectoris: Secondary | ICD-10-CM

## 2024-12-09 DIAGNOSIS — E782 Mixed hyperlipidemia: Secondary | ICD-10-CM | POA: Diagnosis not present

## 2024-12-09 MED ORDER — REPATHA SURECLICK 140 MG/ML ~~LOC~~ SOAJ: 140.0000 mg | SUBCUTANEOUS | 2 refills | Status: AC

## 2024-12-09 MED ORDER — SACUBITRIL-VALSARTAN 24-26 MG PO TABS: 1.0000 | ORAL_TABLET | Freq: Two times a day (BID) | ORAL | 11 refills | Status: AC

## 2024-12-09 NOTE — Patient Instructions (Signed)
 Medication Instructions:  REFILLS SENT TODAY  *If you need a refill on your cardiac medications before your next appointment, please call your pharmacy*  Testing/Procedures: EXERCISE NUCLEAR STRESS TEST    Your physician has requested that you have a Myocardial Perfusion Imaging Study.  Please arrive 15 minutes prior to your appointment time for registration.  The test will take approximately 3 to 4 hours to complete.  Instructions: Beta blockers should be held for 24 hours prior to testing. Otherwise, you may take your medications the morning of the test (someone from the testing area will call you with these specific instructions). Do not eat or drink 3 hours prior to your test, except you may have unlimited water . Do not consume products containing caffeine (regular or decaffeinated) for 12 hours prior to your test (coffee, tea, soda, chocolate, energy products, and some medications). Please wear a 2-piece outfit (no dresses, overalls, etc.). Do not wear cologne, perfume, aftershave, or lotions (deodorant is allowed). If you use an inhaler, bring it with you to the test.  Please note: If anyone comes with you to the appointment, they will need to remain in the main lobby due to limited space in the testing area. We also ask that you not bring children with you during testing. Due to room size and safety concerns, children are not allowed in the testing rooms during exams. Our front office staff cannot provide observation of children in our lobby area while testing is being conducted.  *If you are pregnant or breastfeeding, please notify the technologist prior to your appointment*  Follow-Up: At [facility], you and your health needs are our priority.  As part of our continuing mission to provide you with exceptional heart care, our providers are all part of one team.  This team includes your primary Cardiologist (physician) and Advanced Practice Providers or APPs (Physician  Assistants and Nurse Practitioners) who all work together to provide you with the care you need, when you need it.  Your next appointment:   6 month(s)  Provider:   One of our Advanced Practice Providers (APPs): Morse Clause, PA-C  Hanh Waddell Daniels, PA-C  Saddie Cleaves, NP  Olivia Pavy, PA-C Miriam Shams, NP  Leontine Salen, PA-C Josefa Beauvais, NP  [facility], PA-C [place], PA-C  [place], PA-C [place], [place]  Damien Braver, NP Jon Hails, PA-C  Waddell Donath, PA-C Dayna Dunn, PA-C  [place], PA-C [place], NP Glendia Ferrier, PA-C Callie Goodrich, PA-C  Katlyn West, NP Thom Sluder, PA-C  Alyssa White, NP Rollo Louder, PA-C Xika Zhao, NP    Lamarr Satterfield, NP

## 2024-12-11 ENCOUNTER — Encounter: Payer: Self-pay | Admitting: Pharmacist

## 2024-12-26 ENCOUNTER — Encounter (HOSPITAL_COMMUNITY)
